# Patient Record
Sex: Male | Born: 1965 | Race: White | Hispanic: No | Marital: Married | Smoking: Never smoker
Health system: Southern US, Community
[De-identification: ages and names within clinical notes are randomized; demographics above are authoritative.]

## PROBLEM LIST (undated history)

## (undated) ENCOUNTER — Emergency Department (HOSPITAL_BASED_OUTPATIENT_CLINIC_OR_DEPARTMENT_OTHER): Admission: EM | Payer: Self-pay | Source: Home / Self Care

## (undated) ENCOUNTER — Emergency Department (HOSPITAL_COMMUNITY): Payer: 59 | Source: Home / Self Care

## (undated) DIAGNOSIS — R079 Chest pain, unspecified: Secondary | ICD-10-CM

## (undated) DIAGNOSIS — C6290 Malignant neoplasm of unspecified testis, unspecified whether descended or undescended: Secondary | ICD-10-CM

## (undated) DIAGNOSIS — G4700 Insomnia, unspecified: Secondary | ICD-10-CM

## (undated) DIAGNOSIS — C801 Malignant (primary) neoplasm, unspecified: Secondary | ICD-10-CM

## (undated) DIAGNOSIS — F410 Panic disorder [episodic paroxysmal anxiety] without agoraphobia: Secondary | ICD-10-CM

## (undated) DIAGNOSIS — I10 Essential (primary) hypertension: Secondary | ICD-10-CM

## (undated) HISTORY — DX: Essential (primary) hypertension: I10

## (undated) HISTORY — DX: Malignant neoplasm of unspecified testis, unspecified whether descended or undescended: C62.90

## (undated) HISTORY — DX: Panic disorder (episodic paroxysmal anxiety): F41.0

## (undated) HISTORY — PX: WISDOM TOOTH EXTRACTION: SHX21

## (undated) HISTORY — DX: Chest pain, unspecified: R07.9

## (undated) HISTORY — DX: Insomnia, unspecified: G47.00

---

## 2004-11-03 ENCOUNTER — Encounter: Admission: RE | Admit: 2004-11-03 | Discharge: 2004-11-30 | Payer: Self-pay | Admitting: *Deleted

## 2008-11-14 ENCOUNTER — Ambulatory Visit (HOSPITAL_BASED_OUTPATIENT_CLINIC_OR_DEPARTMENT_OTHER): Admission: RE | Admit: 2008-11-14 | Discharge: 2008-11-14 | Payer: Self-pay | Admitting: Urology

## 2008-11-14 ENCOUNTER — Encounter (INDEPENDENT_AMBULATORY_CARE_PROVIDER_SITE_OTHER): Payer: Self-pay | Admitting: Urology

## 2008-11-29 ENCOUNTER — Ambulatory Visit: Payer: Self-pay | Admitting: Oncology

## 2008-12-11 LAB — COMPREHENSIVE METABOLIC PANEL
ALT: 26 U/L (ref 0–53)
Alkaline Phosphatase: 80 U/L (ref 39–117)
CO2: 25 mEq/L (ref 19–32)
Potassium: 4.1 mEq/L (ref 3.5–5.3)
Sodium: 136 mEq/L (ref 135–145)
Total Bilirubin: 0.7 mg/dL (ref 0.3–1.2)
Total Protein: 7.6 g/dL (ref 6.0–8.3)

## 2008-12-11 LAB — CBC WITH DIFFERENTIAL/PLATELET
BASO%: 0.6 % (ref 0.0–2.0)
LYMPH%: 43.6 % (ref 14.0–49.0)
MCHC: 34.7 g/dL (ref 32.0–36.0)
MONO#: 0.5 10*3/uL (ref 0.1–0.9)
MONO%: 12.3 % (ref 0.0–14.0)
Platelets: 241 10*3/uL (ref 140–400)
RBC: 4.37 10*6/uL (ref 4.20–5.82)
RDW: 13.2 % (ref 11.0–14.6)
WBC: 3.8 10*3/uL — ABNORMAL LOW (ref 4.0–10.3)

## 2008-12-17 ENCOUNTER — Ambulatory Visit: Admission: RE | Admit: 2008-12-17 | Discharge: 2008-12-17 | Payer: Self-pay | Admitting: Oncology

## 2008-12-25 ENCOUNTER — Ambulatory Visit: Payer: Self-pay | Admitting: Oncology

## 2008-12-31 LAB — COMPREHENSIVE METABOLIC PANEL
ALT: 26 U/L (ref 0–53)
AST: 26 U/L (ref 0–37)
Alkaline Phosphatase: 67 U/L (ref 39–117)
Calcium: 9.5 mg/dL (ref 8.4–10.5)
Chloride: 102 mEq/L (ref 96–112)
Creatinine, Ser: 1.08 mg/dL (ref 0.40–1.50)

## 2008-12-31 LAB — CBC WITH DIFFERENTIAL/PLATELET
BASO%: 0.4 % (ref 0.0–2.0)
EOS%: 2.2 % (ref 0.0–7.0)
HCT: 39.9 % (ref 38.4–49.9)
MCH: 31.2 pg (ref 27.2–33.4)
MCHC: 35.8 g/dL (ref 32.0–36.0)
MCV: 86.9 fL (ref 79.3–98.0)
MONO%: 0.4 % (ref 0.0–14.0)
NEUT%: 62.2 % (ref 39.0–75.0)
RDW: 12.2 % (ref 11.0–14.6)
lymph#: 1 10*3/uL (ref 0.9–3.3)

## 2009-01-07 ENCOUNTER — Ambulatory Visit: Admission: RE | Admit: 2009-01-07 | Discharge: 2009-01-07 | Payer: Self-pay | Admitting: Oncology

## 2009-01-07 LAB — CBC WITH DIFFERENTIAL/PLATELET
BASO%: 2.7 % — ABNORMAL HIGH (ref 0.0–2.0)
Basophils Absolute: 0 10*3/uL (ref 0.0–0.1)
EOS%: 1.8 % (ref 0.0–7.0)
HCT: 34.9 % — ABNORMAL LOW (ref 38.4–49.9)
HGB: 12.1 g/dL — ABNORMAL LOW (ref 13.0–17.1)
LYMPH%: 84.5 % — ABNORMAL HIGH (ref 14.0–49.0)
MCH: 30.3 pg (ref 27.2–33.4)
MCHC: 34.7 g/dL (ref 32.0–36.0)
MCV: 87.3 fL (ref 79.3–98.0)
MONO%: 7.3 % (ref 0.0–14.0)
NEUT%: 3.7 % — ABNORMAL LOW (ref 39.0–75.0)
Platelets: 70 10*3/uL — ABNORMAL LOW (ref 140–400)
lymph#: 0.9 10*3/uL (ref 0.9–3.3)

## 2009-01-09 LAB — AFP TUMOR MARKER: AFP-Tumor Marker: 11.1 ng/mL — ABNORMAL HIGH (ref 0.0–8.0)

## 2009-01-09 LAB — COMPREHENSIVE METABOLIC PANEL
AST: 14 U/L (ref 0–37)
Albumin: 4.1 g/dL (ref 3.5–5.2)
Alkaline Phosphatase: 91 U/L (ref 39–117)
BUN: 16 mg/dL (ref 6–23)
Creatinine, Ser: 0.86 mg/dL (ref 0.40–1.50)
Glucose, Bld: 101 mg/dL — ABNORMAL HIGH (ref 70–99)
Total Bilirubin: 0.3 mg/dL (ref 0.3–1.2)

## 2009-01-13 LAB — COMPREHENSIVE METABOLIC PANEL
AST: 25 U/L (ref 0–37)
Albumin: 3.4 g/dL — ABNORMAL LOW (ref 3.5–5.2)
BUN: 19 mg/dL (ref 6–23)
Calcium: 8.7 mg/dL (ref 8.4–10.5)
Chloride: 106 mEq/L (ref 96–112)
Creatinine, Ser: 1.01 mg/dL (ref 0.40–1.50)
Glucose, Bld: 102 mg/dL — ABNORMAL HIGH (ref 70–99)
Potassium: 4 mEq/L (ref 3.5–5.3)

## 2009-01-13 LAB — CBC WITH DIFFERENTIAL/PLATELET
Basophils Absolute: 0 10*3/uL (ref 0.0–0.1)
EOS%: 0.5 % (ref 0.0–7.0)
Eosinophils Absolute: 0 10*3/uL (ref 0.0–0.5)
HCT: 33.5 % — ABNORMAL LOW (ref 38.4–49.9)
HGB: 11.6 g/dL — ABNORMAL LOW (ref 13.0–17.1)
MONO#: 0.5 10*3/uL (ref 0.1–0.9)
NEUT#: 1.1 10*3/uL — ABNORMAL LOW (ref 1.5–6.5)
NEUT%: 38.6 % — ABNORMAL LOW (ref 39.0–75.0)
RDW: 12.7 % (ref 11.0–14.6)
WBC: 3 10*3/uL — ABNORMAL LOW (ref 4.0–10.3)
lymph#: 1.3 10*3/uL (ref 0.9–3.3)

## 2009-01-21 LAB — CBC WITH DIFFERENTIAL/PLATELET
BASO%: 0.7 % (ref 0.0–2.0)
EOS%: 0 % (ref 0.0–7.0)
HCT: 37.5 % — ABNORMAL LOW (ref 38.4–49.9)
LYMPH%: 17.8 % (ref 14.0–49.0)
MCH: 31.5 pg (ref 27.2–33.4)
MCHC: 34.9 g/dL (ref 32.0–36.0)
NEUT%: 79.5 % — ABNORMAL HIGH (ref 39.0–75.0)
Platelets: 552 10*3/uL — ABNORMAL HIGH (ref 140–400)

## 2009-01-21 LAB — COMPREHENSIVE METABOLIC PANEL
ALT: 13 U/L (ref 0–53)
AST: 10 U/L (ref 0–37)
Creatinine, Ser: 0.91 mg/dL (ref 0.40–1.50)
Total Bilirubin: 0.9 mg/dL (ref 0.3–1.2)

## 2009-01-24 ENCOUNTER — Ambulatory Visit: Payer: Self-pay | Admitting: Oncology

## 2009-01-28 LAB — COMPREHENSIVE METABOLIC PANEL
ALT: 19 U/L (ref 0–53)
AST: 24 U/L (ref 0–37)
Albumin: 3.4 g/dL — ABNORMAL LOW (ref 3.5–5.2)
Calcium: 9.1 mg/dL (ref 8.4–10.5)
Chloride: 106 mEq/L (ref 96–112)
Potassium: 4.1 mEq/L (ref 3.5–5.3)
Sodium: 140 mEq/L (ref 135–145)

## 2009-01-28 LAB — CBC WITH DIFFERENTIAL/PLATELET
BASO%: 0 % (ref 0.0–2.0)
EOS%: 0.1 % (ref 0.0–7.0)
HCT: 29.1 % — ABNORMAL LOW (ref 38.4–49.9)
MCH: 32 pg (ref 27.2–33.4)
MCHC: 34.9 g/dL (ref 32.0–36.0)
MCV: 91.7 fL (ref 79.3–98.0)
NEUT#: 13.1 10*3/uL — ABNORMAL HIGH (ref 1.5–6.5)
Platelets: 100 10*3/uL — ABNORMAL LOW (ref 140–400)
RBC: 3.17 10*6/uL — ABNORMAL LOW (ref 4.20–5.82)
RDW: 12.5 % (ref 11.0–14.6)
WBC: 14.5 10*3/uL — ABNORMAL HIGH (ref 4.0–10.3)
lymph#: 1.3 10*3/uL (ref 0.9–3.3)

## 2009-01-30 ENCOUNTER — Emergency Department (HOSPITAL_COMMUNITY): Admission: EM | Admit: 2009-01-30 | Discharge: 2009-01-30 | Payer: Self-pay | Admitting: Emergency Medicine

## 2009-02-11 ENCOUNTER — Ambulatory Visit (HOSPITAL_COMMUNITY): Admission: RE | Admit: 2009-02-11 | Discharge: 2009-02-11 | Payer: Self-pay | Admitting: Oncology

## 2009-02-21 ENCOUNTER — Ambulatory Visit: Payer: Self-pay | Admitting: Oncology

## 2009-02-25 LAB — CBC WITH DIFFERENTIAL/PLATELET
Basophils Absolute: 0.1 10*3/uL (ref 0.0–0.1)
Eosinophils Absolute: 0.2 10*3/uL (ref 0.0–0.5)
HCT: 34.2 % — ABNORMAL LOW (ref 38.4–49.9)
HGB: 11.9 g/dL — ABNORMAL LOW (ref 13.0–17.1)
MCV: 93.8 fL (ref 79.3–98.0)
NEUT#: 1.4 10*3/uL — ABNORMAL LOW (ref 1.5–6.5)
NEUT%: 40 % (ref 39.0–75.0)
RDW: 17.2 % — ABNORMAL HIGH (ref 11.0–14.6)
lymph#: 1.2 10*3/uL (ref 0.9–3.3)

## 2009-02-27 LAB — COMPREHENSIVE METABOLIC PANEL
Albumin: 4.3 g/dL (ref 3.5–5.2)
BUN: 16 mg/dL (ref 6–23)
Calcium: 9.7 mg/dL (ref 8.4–10.5)
Chloride: 104 mEq/L (ref 96–112)
Creatinine, Ser: 0.85 mg/dL (ref 0.40–1.50)
Glucose, Bld: 101 mg/dL — ABNORMAL HIGH (ref 70–99)
Potassium: 4.6 mEq/L (ref 3.5–5.3)

## 2009-02-27 LAB — BETA HCG QUANT (REF LAB): Beta hCG, Tumor Marker: 0.6 m[IU]/mL (ref <5.0)

## 2009-04-28 ENCOUNTER — Ambulatory Visit: Payer: Self-pay | Admitting: Oncology

## 2009-04-30 LAB — CBC WITH DIFFERENTIAL/PLATELET
BASO%: 0.3 % (ref 0.0–2.0)
Basophils Absolute: 0 10*3/uL (ref 0.0–0.1)
EOS%: 0.3 % (ref 0.0–7.0)
HCT: 41.9 % (ref 38.4–49.9)
HGB: 14.4 g/dL (ref 13.0–17.1)
MCH: 33.3 pg (ref 27.2–33.4)
MCHC: 34.4 g/dL (ref 32.0–36.0)
MCV: 96.6 fL (ref 79.3–98.0)
MONO%: 8.5 % (ref 0.0–14.0)
NEUT%: 74.3 % (ref 39.0–75.0)
RDW: 12.9 % (ref 11.0–14.6)
lymph#: 1.7 10*3/uL (ref 0.9–3.3)

## 2009-05-03 LAB — COMPREHENSIVE METABOLIC PANEL
AST: 17 U/L (ref 0–37)
Albumin: 4.4 g/dL (ref 3.5–5.2)
Alkaline Phosphatase: 80 U/L (ref 39–117)
Glucose, Bld: 115 mg/dL — ABNORMAL HIGH (ref 70–99)
Potassium: 3.9 mEq/L (ref 3.5–5.3)
Sodium: 135 mEq/L (ref 135–145)
Total Bilirubin: 0.2 mg/dL — ABNORMAL LOW (ref 0.3–1.2)
Total Protein: 7.1 g/dL (ref 6.0–8.3)

## 2009-05-03 LAB — AFP TUMOR MARKER: AFP-Tumor Marker: 4.4 ng/mL (ref 0.0–8.0)

## 2009-05-17 HISTORY — PX: SURGERY SCROTAL / TESTICULAR: SUR1316

## 2009-07-18 ENCOUNTER — Ambulatory Visit: Payer: Self-pay | Admitting: Oncology

## 2009-07-25 ENCOUNTER — Ambulatory Visit (HOSPITAL_COMMUNITY): Admission: RE | Admit: 2009-07-25 | Discharge: 2009-07-25 | Payer: Self-pay | Admitting: Oncology

## 2009-07-25 LAB — COMPREHENSIVE METABOLIC PANEL
ALT: 25 U/L (ref 0–53)
AST: 26 U/L (ref 0–37)
Calcium: 10.1 mg/dL (ref 8.4–10.5)
Chloride: 100 mEq/L (ref 96–112)
Creatinine, Ser: 1.09 mg/dL (ref 0.40–1.50)
Potassium: 4.5 mEq/L (ref 3.5–5.3)
Sodium: 137 mEq/L (ref 135–145)

## 2009-07-25 LAB — CBC WITH DIFFERENTIAL/PLATELET
BASO%: 0.7 % (ref 0.0–2.0)
EOS%: 1.4 % (ref 0.0–7.0)
MCH: 32.8 pg (ref 27.2–33.4)
MCHC: 34.7 g/dL (ref 32.0–36.0)
NEUT%: 38.4 % — ABNORMAL LOW (ref 39.0–75.0)
RBC: 4.01 10*6/uL — ABNORMAL LOW (ref 4.20–5.82)
RDW: 15.2 % — ABNORMAL HIGH (ref 11.0–14.6)
lymph#: 1.5 10*3/uL (ref 0.9–3.3)

## 2009-07-29 LAB — BETA HCG QUANT (REF LAB): Beta hCG, Tumor Marker: 0.9 m[IU]/mL (ref <5.0)

## 2009-07-29 LAB — AFP TUMOR MARKER: AFP-Tumor Marker: 4.8 ng/mL (ref 0.0–8.0)

## 2009-10-23 ENCOUNTER — Ambulatory Visit: Payer: Self-pay | Admitting: Oncology

## 2009-10-27 ENCOUNTER — Ambulatory Visit (HOSPITAL_COMMUNITY): Admission: RE | Admit: 2009-10-27 | Discharge: 2009-10-27 | Payer: Self-pay | Admitting: Oncology

## 2009-10-27 LAB — COMPREHENSIVE METABOLIC PANEL
Alkaline Phosphatase: 69 U/L (ref 39–117)
CO2: 27 mEq/L (ref 19–32)
Creatinine, Ser: 1.02 mg/dL (ref 0.40–1.50)
Glucose, Bld: 110 mg/dL — ABNORMAL HIGH (ref 70–99)
Total Bilirubin: 0.8 mg/dL (ref 0.3–1.2)

## 2009-10-27 LAB — CBC WITH DIFFERENTIAL/PLATELET
BASO%: 0.7 % (ref 0.0–2.0)
Eosinophils Absolute: 0 10*3/uL (ref 0.0–0.5)
HCT: 40.5 % (ref 38.4–49.9)
LYMPH%: 39.2 % (ref 14.0–49.0)
MCHC: 34.2 g/dL (ref 32.0–36.0)
MCV: 94.8 fL (ref 79.3–98.0)
MONO#: 0.4 10*3/uL (ref 0.1–0.9)
MONO%: 12.3 % (ref 0.0–14.0)
NEUT%: 46.8 % (ref 39.0–75.0)
Platelets: 226 10*3/uL (ref 140–400)
WBC: 3.6 10*3/uL — ABNORMAL LOW (ref 4.0–10.3)

## 2009-10-27 LAB — LACTATE DEHYDROGENASE: LDH: 136 U/L (ref 94–250)

## 2009-10-29 LAB — BETA HCG QUANT (REF LAB): Beta hCG, Tumor Marker: 0.7 m[IU]/mL (ref <5.0)

## 2009-10-29 LAB — AFP TUMOR MARKER: AFP-Tumor Marker: 3.8 ng/mL (ref 0.0–8.0)

## 2010-01-08 IMAGING — CT CT ABDOMEN W/ CM
2 of 6 series · 16 of 46 positions shown, 18 images · IV contrast (omnipaque)
Comparison: None.

CT CHEST

CLINICAL DATA: History of recurrent testicular cancer.  Initial
diagnosis in the left testicle in 1113, for which the patient
underwent left orchiectomy.  Recurrence in the right testicle in
November 2008, for which the patient underwent right orchiectomy.  The
patient completed chemotherapy earlier this month.

CT CHEST, ABDOMEN AND PELVIS WITH CONTRAST 02/11/2009:
TECHNIQUE: Multidetector CT imaging of the chest, abdomen and
pelvis was performed following the standard protocol during bolus
administration of intravenous contrast.
Contrast: 1 ml [id number] IV.  Dilute Omnipaque as oral
contrast.

[Series 2: cap with st · axial · 0.78mm/px · z∈[-662,-72]mm · 13 of 136 slices shown, 15 images]
[im 9/136  soft-tissue]
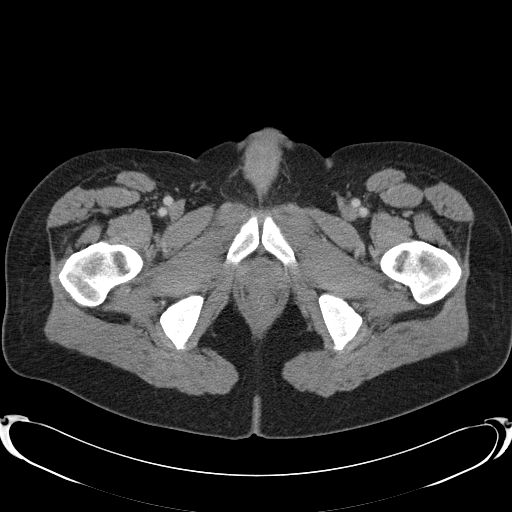
[im 9/136  bone]
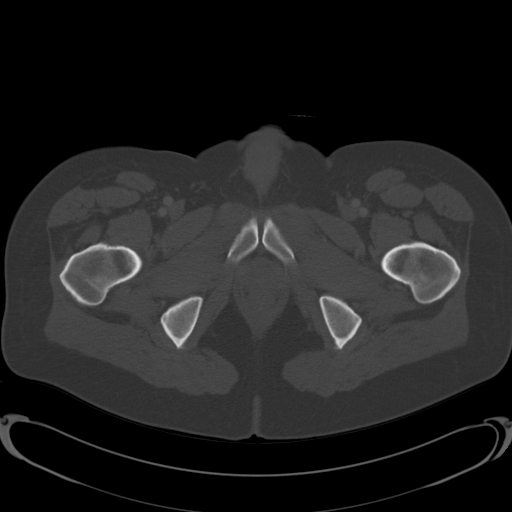
[im 17/136  soft-tissue]
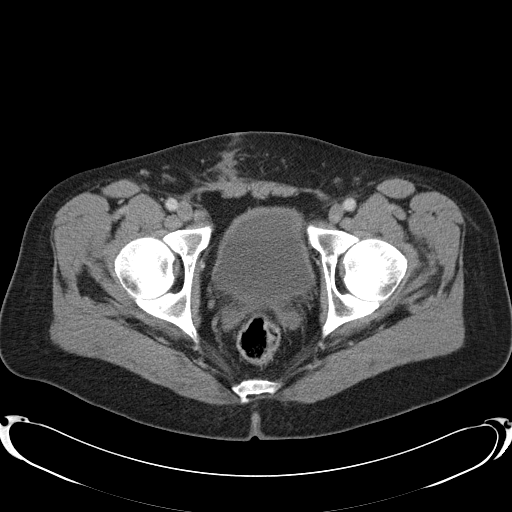
[im 26/136  soft-tissue]
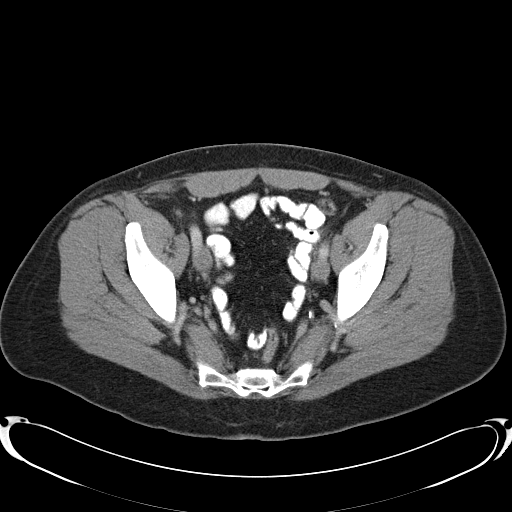
[im 43/136  soft-tissue]
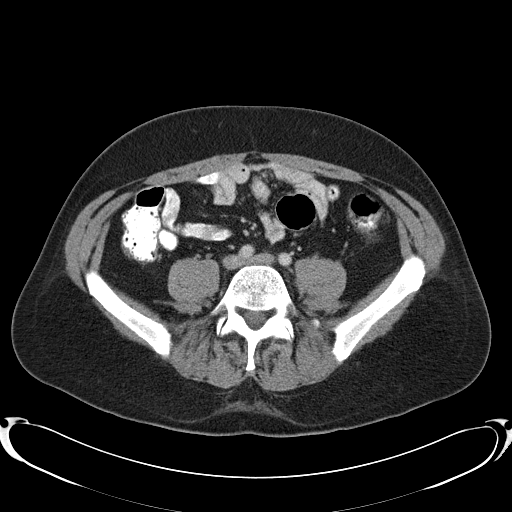
[im 51/136  soft-tissue]
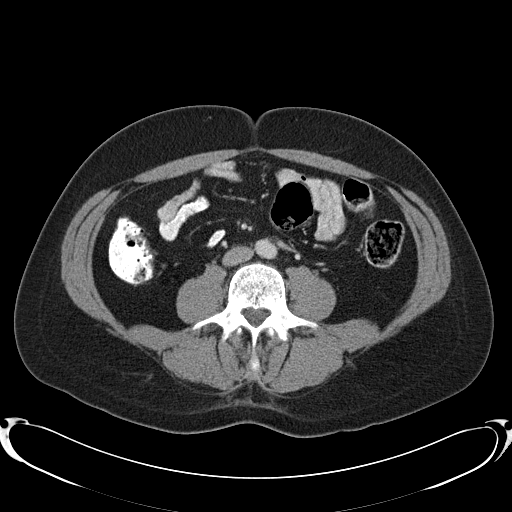
[im 60/136  soft-tissue]
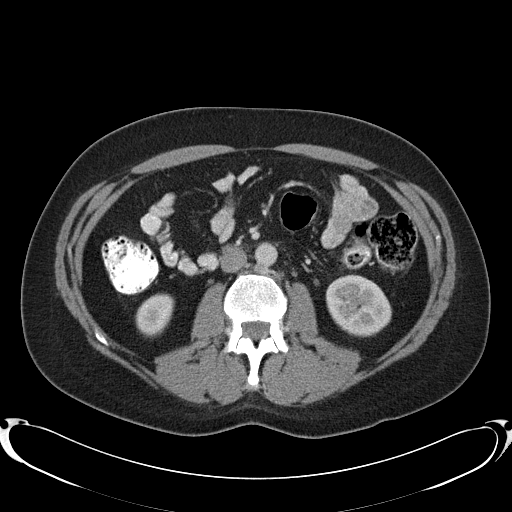
[im 68/136  soft-tissue]
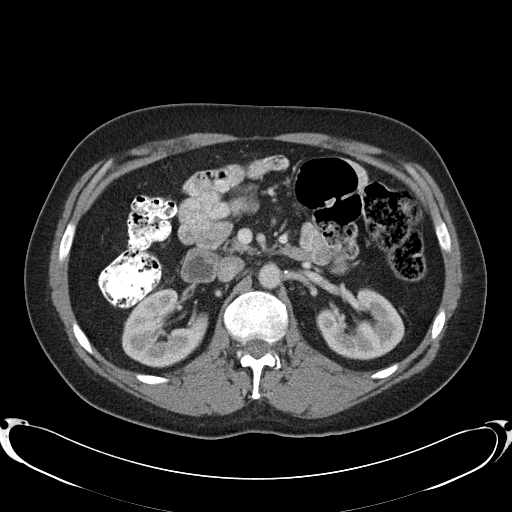
[im 76/136  soft-tissue]
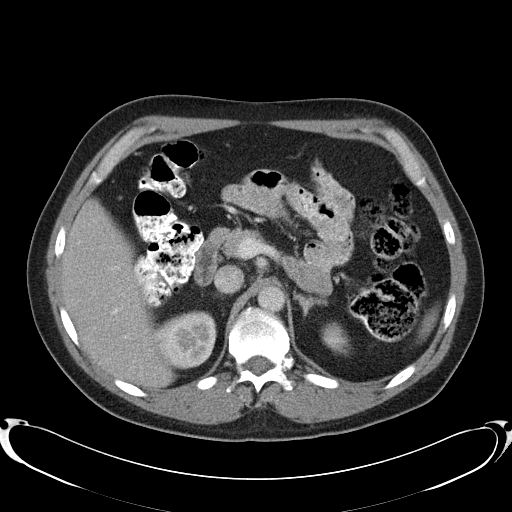
[im 85/136  soft-tissue]
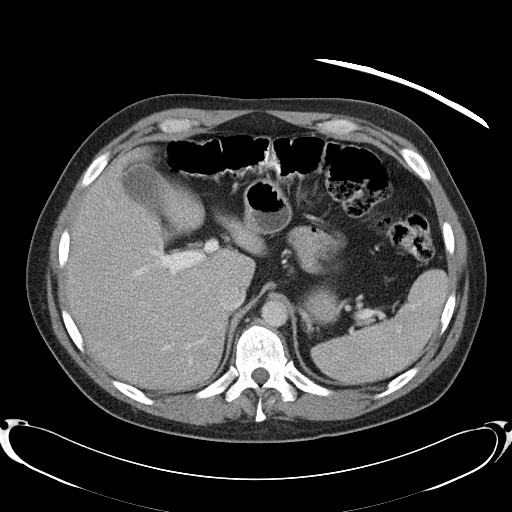
[im 85/136  bone]
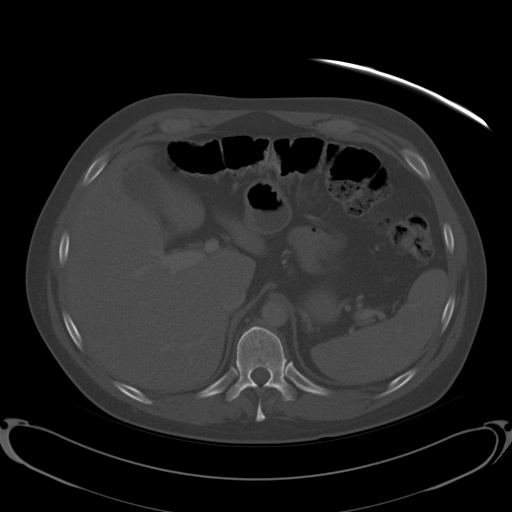
[im 93/136  soft-tissue]
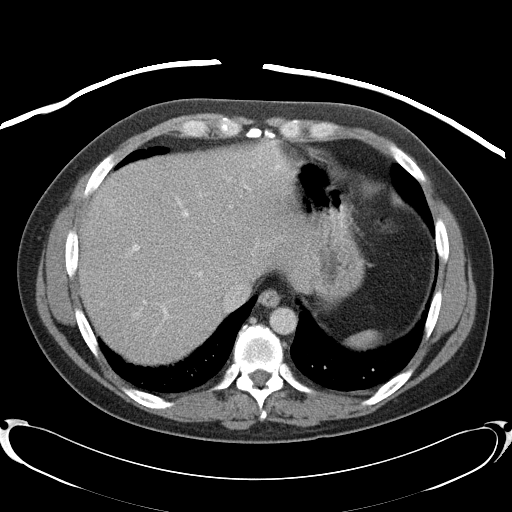
[im 110/136  soft-tissue]
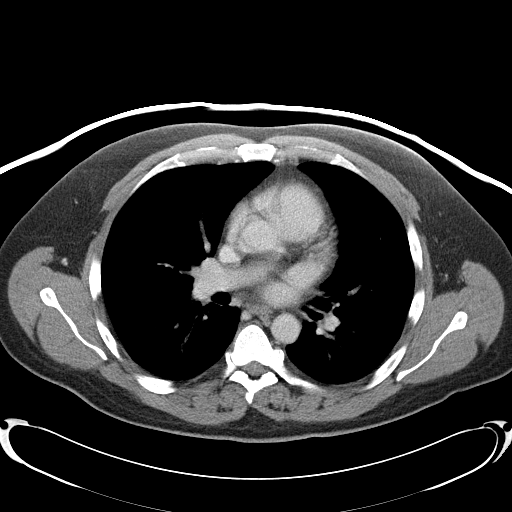
[im 119/136  soft-tissue]
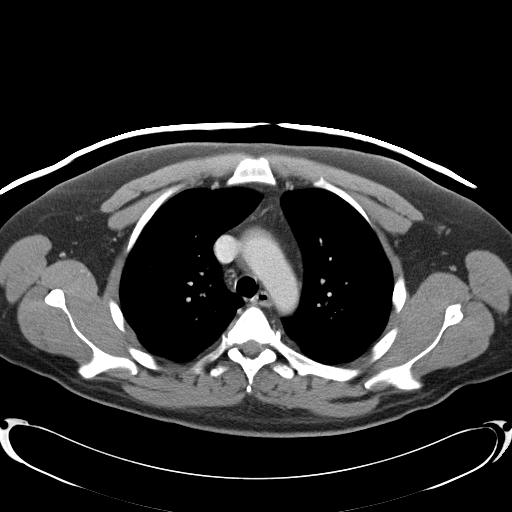
[im 127/136  soft-tissue]
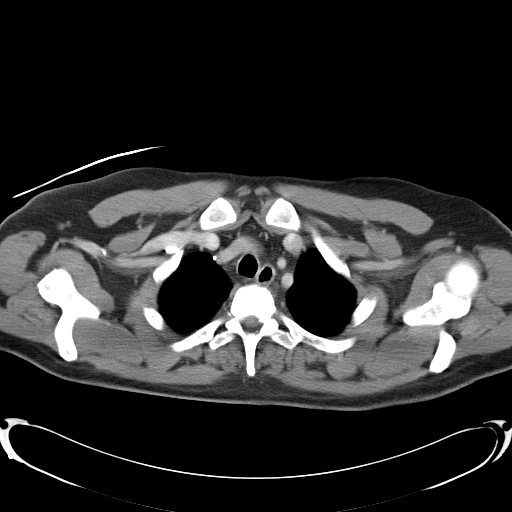

[Series 602: <mpr thick range> · coronal · 1.33mm/px · 3 of 79 slices shown]
[im 27/79  soft-tissue]
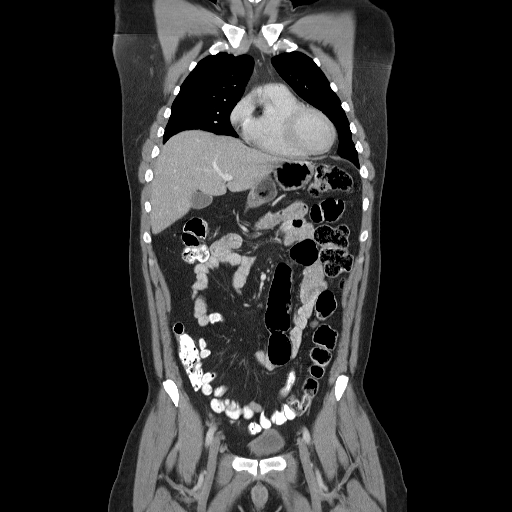
[im 35/79  soft-tissue]
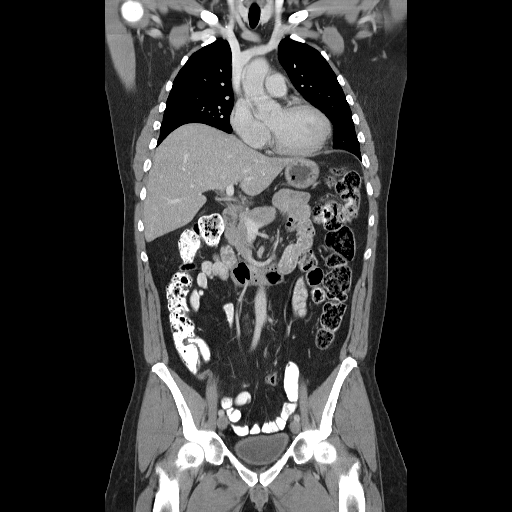
[im 44/79  soft-tissue]
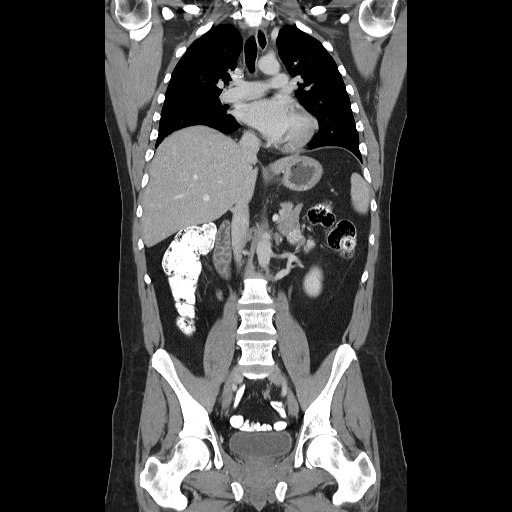

[16 of 46 positions shown; findings below may reference images not displayed]

FINDINGS: No significant mediastinal, hilar, or axillary
lymphadenopathy.  Heart size normal.  No visible coronary artery
calcification.  No pericardial effusion.  Bovine aortic arch
anatomy (left common carotid artery arising from the innominate
artery).  No significant thoracic aortic atherosclerosis.
Visualized thyroid gland unremarkable.

Subpleural node adjacent to the major fissure on the left.  No
suspicious pulmonary parenchymal nodules or masses.  Mild dependent
atelectasis posteriorly in the lower lobes. Lungs otherwise clear.
No evidence of interstitial lung disease.  No pleural effusions.
Bone window images demonstrate mid and lower thoracic spondylosis
but no evidence of osseous metastatic disease.
IMPRESSION: 1.  No evidence of metastatic disease in the chest.
2.  No acute cardiopulmonary disease.

CT ABDOMEN
FINDINGS: Scattered normal-sized retroperitoneal lymph nodes; no
significant lymphadenopathy in the abdomen.  Normal appearing
liver, spleen, pancreas, adrenal glands, and kidneys.  Gallbladder
unremarkable by CT.  No biliary ductal dilation.  Stomach and
visualized small bowel and colon unremarkable.  Minimal abdominal
aortic atherosclerosis.  Widely patent visceral arteries.
Circumaortic left renal vein.  No ascites.  Bone window images
demonstrate mild degenerative changes in the lumbar spine but no
evidence of osseous metastatic disease.
IMPRESSION: 1.  No evidence of metastatic disease in the abdomen.

2.  No acute abnormalities in the abdomen.

CT PELVIS
FINDINGS: Surgical scar in the right low pelvis.  Right testicular
prosthesis.  No significant pelvic lymphadenopathy.  Visualized
colon and small bowel unremarkable;  redundant sigmoid colon which
extends well into the upper abdomen.  No ascites.  Urinary bladder
unremarkable.  Prostate gland and seminal vesicles normal for age.
Bone window images unremarkable.
IMPRESSION: 1.  No evidence of metastatic disease in the pelvis.
2.  No acute abnormalities in the pelvis.

## 2010-04-28 ENCOUNTER — Ambulatory Visit (HOSPITAL_BASED_OUTPATIENT_CLINIC_OR_DEPARTMENT_OTHER): Payer: 59 | Admitting: Oncology

## 2010-04-30 LAB — CBC WITH DIFFERENTIAL/PLATELET
Basophils Absolute: 0 10*3/uL (ref 0.0–0.1)
EOS%: 1.4 % (ref 0.0–7.0)
HGB: 14.1 g/dL (ref 13.0–17.1)
MCH: 32.4 pg (ref 27.2–33.4)
MCV: 94.6 fL (ref 79.3–98.0)
MONO%: 16.2 % — ABNORMAL HIGH (ref 0.0–14.0)
NEUT%: 36.8 % — ABNORMAL LOW (ref 39.0–75.0)
RDW: 13.7 % (ref 11.0–14.6)

## 2010-05-05 LAB — COMPREHENSIVE METABOLIC PANEL
AST: 28 U/L (ref 0–37)
Alkaline Phosphatase: 82 U/L (ref 39–117)
BUN: 20 mg/dL (ref 6–23)
Creatinine, Ser: 1.05 mg/dL (ref 0.40–1.50)
Potassium: 4.1 mEq/L (ref 3.5–5.3)

## 2010-05-05 LAB — BETA HCG QUANT (REF LAB): Beta hCG, Tumor Marker: 0.7 m[IU]/mL (ref <5.0)

## 2010-06-05 ENCOUNTER — Other Ambulatory Visit: Payer: Self-pay | Admitting: Oncology

## 2010-06-05 DIAGNOSIS — C6290 Malignant neoplasm of unspecified testis, unspecified whether descended or undescended: Secondary | ICD-10-CM

## 2010-08-21 LAB — COMPREHENSIVE METABOLIC PANEL
Albumin: 3.5 g/dL (ref 3.5–5.2)
Alkaline Phosphatase: 108 U/L (ref 39–117)
BUN: 12 mg/dL (ref 6–23)
Chloride: 103 mEq/L (ref 96–112)
Glucose, Bld: 98 mg/dL (ref 70–99)
Potassium: 4 mEq/L (ref 3.5–5.1)
Total Bilirubin: 0.4 mg/dL (ref 0.3–1.2)

## 2010-08-21 LAB — URINALYSIS, ROUTINE W REFLEX MICROSCOPIC
Hgb urine dipstick: NEGATIVE
Ketones, ur: NEGATIVE mg/dL
Protein, ur: NEGATIVE mg/dL
Urobilinogen, UA: 0.2 mg/dL (ref 0.0–1.0)

## 2010-08-21 LAB — DIFFERENTIAL
Basophils Absolute: 0.1 10*3/uL (ref 0.0–0.1)
Basophils Relative: 0 % (ref 0–1)
Eosinophils Absolute: 0 10*3/uL (ref 0.0–0.7)
Monocytes Absolute: 0.7 10*3/uL (ref 0.1–1.0)
Neutro Abs: 15.8 10*3/uL — ABNORMAL HIGH (ref 1.7–7.7)
Neutrophils Relative %: 90 % — ABNORMAL HIGH (ref 43–77)

## 2010-08-21 LAB — CBC
HCT: 32.7 % — ABNORMAL LOW (ref 39.0–52.0)
Hemoglobin: 10.9 g/dL — ABNORMAL LOW (ref 13.0–17.0)
RBC: 3.5 MIL/uL — ABNORMAL LOW (ref 4.22–5.81)
WBC: 17.6 10*3/uL — ABNORMAL HIGH (ref 4.0–10.5)

## 2010-08-21 LAB — RAPID URINE DRUG SCREEN, HOSP PERFORMED
Amphetamines: NOT DETECTED
Barbiturates: NOT DETECTED
Tetrahydrocannabinol: NOT DETECTED

## 2010-08-23 LAB — POCT HEMOGLOBIN-HEMACUE: Hemoglobin: 15.3 g/dL (ref 13.0–17.0)

## 2010-09-29 NOTE — Op Note (Signed)
Jeff [name], Jeff [name]                 ACCOUNT NO.:  [id number]   MEDICAL RECORD NO.:  [id number]          PATIENT TYPE:  AMB   LOCATION:  [facility]                         FACILITY:  [facility]   PHYSICIAN:  [name], MD      DATE OF BIRTH:  1965-08-10   DATE OF PROCEDURE:  11/14/2008  DATE OF DISCHARGE:                               OPERATIVE REPORT   PREOPERATIVE DIAGNOSIS:  1. History of testicular cancer.  2. Right testicular mass.   POSTOPERATIVE DIAGNOSIS:  Testicular cancer.   PROCEDURES:  1. Right inguinal exploration.  2. Excisional biopsy of right testicular mass.  3. Intraoperative testicular ultrasound.  4. Right radical orchiectomy.  5. Placement of right testicular prosthesis.   SURGEON:  Dr. [name].   ASSISTANT:  Dr. Edward Qualia.   ANESTHESIA:  General.   COMPLICATIONS:  None.   ESTIMATED BLOOD LOSS:  Minimal.   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  He presented with a right testicular mass and right  testicular pain which he developed after a traumatic event.  He was  initially managed with close surveillance ultrasound which did not  demonstrate a mass with discrete borders, and his testicular tumor  markers were not initially elevated.  On repeat evaluation one week  later, his testicular tumor markers were noted to be rising, and his  mass was still present despite resolution of his pain.  We discussed  options and elected to proceed with the above procedures.  The potential  risks, complications, and alternative treatment options were discussed  in detail.  Specifically, we discussed the issue of infertility and  whether he would wish to bank any sperm preoperatively.  He declined  this option and elected to proceed as planned.   DESCRIPTION OF PROCEDURE:  The patient was taken to the operating room  and general anesthesia was  induced.  He was given preoperative  antibiotics, placed in the supine position, and his genitalia and right  inguinal region were prepped and draped in the usual sterile fashion.  The right testis was palpated and there was noted be a firm mass off the  upper pole of the right testis measuring approximately 1.5 cm.  An  inguinal incision was then made in the skin approximately 2 cm above the  inguinal ligament and carried down through subcutaneous tissues with  electrocautery.  Scarpa's fascia was identified and was divided and the  external oblique aponeurosis was identified and cleared off of the  overlying fat.  Using a scalpel, the external oblique was incised, and  this incision in the external oblique was then extended both cephalad  and inferior with Metzenbaum scissors.  The spermatic cord was  identified and was able to be encircled at the level of the pubic  tubercle.  A Penrose dressing was placed in a tourniquet fashion around  the spermatic cord in this location.  Care was taken to preserve the  ilioinguinal nerve.  The scrotum was then inverted allowing  the testicle  to be delivered into the operative field.  The gubernacular attachments  were carefully taken down with electrocautery and the testicle was  brought up into the operative field.  Sterile towels were then placed  around the testicle and spermatic cord, and the tourniquet was again  secured.  At this point the tunica vaginalis was incised and the  testicle was inspected.  There did appear to be a firm mass off the  anterior upper pole of the right testis. At this point intraoperative  ultrasound was used to visualize the testicular mass.  There was noted  to be a hypoechoic mass which was better defined on this examination  compared to his prior ultrasound evaluations.  This mass did appear to  extend toward the mid section of the testis and was very concerning for  a testicular malignancy.  An excisional biopsy  was then performed of the  aforementioned mass and sent to pathology for frozen section analysis.  This returned with features consistent with a testicular malignancy and  aspects of the specimen that were concerning for a high-grade embryonal  carcinoma.  At this point, sterile towels were used to isolate the  testis.  Sterile gloves were changed and the spermatic cord was isolated  up to the internal inguinal ring, where it was clamped with a Kelly  clamp.  The spermatic cord was then doubly ligated with a zero silk tie  as well as zero silk suture ligatures and divided, with the specimen  sent for permanent pathologic analysis.  The proximal silk tie was left  long and the spermatic cord stump was allowed to retract into the  retroperitoneum.  The inguinal canal and scrotum were then copiously  irrigated with antibiotic solution and preparations were made for  placement of the testicular prosthesis.  The scrotum was inverted with  the dependent portion of the scrotum identified.  A figure-of-eight 4-0  Prolene suture was then used to secure the medium sized Coloplast saline  testicular prosthesis.  It was filled with approximately 15 mL of saline  with all air attempted to be removed from the prosthesis.  This was then  brought into the dependent portion of the scrotum, and preparations were  made for closure.  The incision was then again irrigated copiously with  antibiotic solution and the external oblique aponeurosis was  reapproximated with a running zero silk suture.  Scarpa fascia was  reapproximated with a running 3-0 Vicryl suture and the skin was  reapproximated with a running 4-0 Monocryl subcuticular closure.  Dermabond was applied to the skin.  The patient appeared to tolerate the  procedure well without complications.  He was able to be awakened and  transferred to the recovery room in satisfactory condition.      [name], MD  Electronically Signed      LB/MEDQ  D:  11/14/2008  T:  11/14/2008  Job:  [id number]

## 2010-10-14 ENCOUNTER — Other Ambulatory Visit: Payer: Self-pay | Admitting: Oncology

## 2010-10-14 DIAGNOSIS — C6290 Malignant neoplasm of unspecified testis, unspecified whether descended or undescended: Secondary | ICD-10-CM

## 2010-10-22 ENCOUNTER — Ambulatory Visit (HOSPITAL_COMMUNITY)
Admission: RE | Admit: 2010-10-22 | Discharge: 2010-10-22 | Disposition: A | Discharge status: Home / Self Care | Payer: 59 | Source: Ambulatory Visit | Attending: Oncology | Admitting: Oncology

## 2010-10-22 ENCOUNTER — Encounter (HOSPITAL_COMMUNITY): Payer: Self-pay

## 2010-10-22 ENCOUNTER — Other Ambulatory Visit (HOSPITAL_COMMUNITY): Payer: Self-pay

## 2010-10-22 DIAGNOSIS — I700 Atherosclerosis of aorta: Secondary | ICD-10-CM | POA: Insufficient documentation

## 2010-10-22 DIAGNOSIS — C6290 Malignant neoplasm of unspecified testis, unspecified whether descended or undescended: Secondary | ICD-10-CM

## 2010-10-22 DIAGNOSIS — Z9689 Presence of other specified functional implants: Secondary | ICD-10-CM | POA: Insufficient documentation

## 2010-10-22 DIAGNOSIS — Z8547 Personal history of malignant neoplasm of testis: Secondary | ICD-10-CM | POA: Insufficient documentation

## 2010-10-22 DIAGNOSIS — J984 Other disorders of lung: Secondary | ICD-10-CM | POA: Insufficient documentation

## 2010-10-22 DIAGNOSIS — I998 Other disorder of circulatory system: Secondary | ICD-10-CM | POA: Insufficient documentation

## 2010-10-22 DIAGNOSIS — I701 Atherosclerosis of renal artery: Secondary | ICD-10-CM | POA: Insufficient documentation

## 2010-10-22 HISTORY — DX: Malignant (primary) neoplasm, unspecified: C80.1

## 2010-10-22 LAB — CBC WITH DIFFERENTIAL/PLATELET
BASO%: 0.7 % (ref 0.0–2.0)
Eosinophils Absolute: 0.1 10*3/uL (ref 0.0–0.5)
MCHC: 34.6 g/dL (ref 32.0–36.0)
MONO#: 0.7 10*3/uL (ref 0.1–0.9)
NEUT#: 1.7 10*3/uL (ref 1.5–6.5)
Platelets: 206 10*3/uL (ref 140–400)
RBC: 4.93 10*6/uL (ref 4.20–5.82)
RDW: 13.9 % (ref 11.0–14.6)
WBC: 4.2 10*3/uL (ref 4.0–10.3)
lymph#: 1.8 10*3/uL (ref 0.9–3.3)

## 2010-10-22 MED ORDER — IOHEXOL 300 MG/ML  SOLN
125.0000 mL | Freq: Once | INTRAMUSCULAR | Status: AC | PRN
  Administered 2010-10-22: 125 mL via INTRAVENOUS

## 2010-10-24 LAB — COMPREHENSIVE METABOLIC PANEL
ALT: 22 U/L (ref 0–53)
AST: 22 U/L (ref 0–37)
CO2: 23 mEq/L (ref 19–32)
Calcium: 8.9 mg/dL (ref 8.4–10.5)
Chloride: 105 mEq/L (ref 96–112)
Potassium: 4.5 mEq/L (ref 3.5–5.3)
Sodium: 135 mEq/L (ref 135–145)
Total Protein: 6.8 g/dL (ref 6.0–8.3)

## 2010-10-24 LAB — LACTATE DEHYDROGENASE: LDH: 146 U/L (ref 94–250)

## 2010-10-24 LAB — BETA HCG QUANT (REF LAB): Beta hCG, Tumor Marker: 0.8 m[IU]/mL (ref <5.0)

## 2010-10-29 ENCOUNTER — Encounter (HOSPITAL_BASED_OUTPATIENT_CLINIC_OR_DEPARTMENT_OTHER): Payer: 59 | Admitting: Oncology

## 2010-10-29 DIAGNOSIS — C6290 Malignant neoplasm of unspecified testis, unspecified whether descended or undescended: Secondary | ICD-10-CM

## 2010-10-29 DIAGNOSIS — D709 Neutropenia, unspecified: Secondary | ICD-10-CM

## 2011-04-21 ENCOUNTER — Encounter: Payer: Self-pay | Admitting: *Deleted

## 2011-04-28 ENCOUNTER — Encounter: Payer: Self-pay | Admitting: *Deleted

## 2011-04-30 ENCOUNTER — Ambulatory Visit: Payer: 59 | Admitting: Oncology

## 2011-04-30 ENCOUNTER — Other Ambulatory Visit: Payer: 59

## 2011-06-04 ENCOUNTER — Encounter: Payer: Self-pay | Admitting: *Deleted

## 2011-06-08 ENCOUNTER — Other Ambulatory Visit: Payer: 59 | Admitting: Lab

## 2011-06-08 ENCOUNTER — Ambulatory Visit (HOSPITAL_BASED_OUTPATIENT_CLINIC_OR_DEPARTMENT_OTHER): Payer: 59 | Admitting: Oncology

## 2011-06-08 ENCOUNTER — Telehealth: Payer: Self-pay | Admitting: Oncology

## 2011-06-08 VITALS — BP 160/99 | HR 103 | Temp 97.4°F | Ht 71.0 in | Wt 234.5 lb

## 2011-06-08 DIAGNOSIS — I10 Essential (primary) hypertension: Secondary | ICD-10-CM

## 2011-06-08 DIAGNOSIS — D709 Neutropenia, unspecified: Secondary | ICD-10-CM

## 2011-06-08 DIAGNOSIS — C6290 Malignant neoplasm of unspecified testis, unspecified whether descended or undescended: Secondary | ICD-10-CM

## 2011-06-08 LAB — LACTATE DEHYDROGENASE: LDH: 203 U/L (ref 94–250)

## 2011-06-08 LAB — CBC WITH DIFFERENTIAL/PLATELET
BASO%: 0.5 % (ref 0.0–2.0)
EOS%: 1.1 % (ref 0.0–7.0)
MCH: 32.3 pg (ref 27.2–33.4)
MCHC: 34.6 g/dL (ref 32.0–36.0)
MONO#: 0.7 10*3/uL (ref 0.1–0.9)
RBC: 4.36 10*6/uL (ref 4.20–5.82)
RDW: 13.9 % (ref 11.0–14.6)
WBC: 5.4 10*3/uL (ref 4.0–10.3)
lymph#: 1.8 10*3/uL (ref 0.9–3.3)

## 2011-06-08 LAB — COMPREHENSIVE METABOLIC PANEL
ALT: 44 U/L (ref 0–53)
AST: 40 U/L — ABNORMAL HIGH (ref 0–37)
CO2: 22 mEq/L (ref 19–32)
Calcium: 9.1 mg/dL (ref 8.4–10.5)
Chloride: 104 mEq/L (ref 96–112)
Sodium: 137 mEq/L (ref 135–145)
Total Protein: 8 g/dL (ref 6.0–8.3)

## 2011-06-08 NOTE — Progress Notes (Signed)
Hematology and Oncology Follow Up Visit  JHAMIR PICKUP [id number] Dec 10, 1965 46 y.o. 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  He had an embryonal component for his cancer diagnosed in July 2007.    Prior Therapy:  1. Status post orchiectomy done in July 2010. 2. The patient received 2 cycles adjuvant BEP for stage IB disease.  Therapy concluded in September 2010.  He had no evidence of any residual disease and continues to be in remission at this time. 3. 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  Interim History:  Mr. Winegarden presents today for a followup visit.  He is a very healthy 46 year old gentleman with the above history.  He has continued to do very well without any evidence to suggest recurrent disease.  He had not reported any abdominal pain, had not reported any major appetite changes; as a matter of fact, he had weight gain.  He has continued on testosterone supplements and had not really reported any major changes in his activity level or performance status.  He had not reported any weight loss.  Did not report any back pain.  Did not report any early satiety or lymphadenopathy.  Medications: I have reviewed the patient's current medications. Current outpatient prescriptions:buPROPion (WELLBUTRIN XL) 300 MG 24 hr tablet, Take 300 mg by mouth daily.  , Disp: , Rfl: ;  zolpidem (AMBIEN) 10 MG tablet, Take 10 mg by mouth at bedtime as needed.  , Disp: , Rfl:   Allergies:  Allergies  Allergen Reactions  . Penicillins     Past Medical History, Surgical history, Social history, and Family History were reviewed and updated.  Review of Systems: Constitutional:  Negative for fever, chills, night sweats, anorexia, weight loss, pain. Cardiovascular: no chest pain or dyspnea on exertion Respiratory: no cough, shortness of  breath, or wheezing Neurological: no TIA or stroke symptoms Dermatological: negative ENT: negative Skin: Negative. Gastrointestinal: no abdominal pain, change in bowel habits, or black or bloody stools Genito-Urinary: no dysuria, trouble voiding, or hematuria Hematological and Lymphatic: negative Breast: negative Musculoskeletal: negative Remaining ROS negative. Physical Exam: Blood pressure 160/99, pulse 103, temperature 97.4 F (36.3 C), temperature source Oral, height 5\' 11"  (1.803 m), weight 234 lb 8 oz (106.369 kg). ECOG: 0 General appearance: alert Head: Normocephalic, without obvious abnormality, atraumatic Neck: no adenopathy, no carotid bruit, no JVD, supple, symmetrical, trachea midline and thyroid not enlarged, symmetric, no tenderness/mass/nodules Lymph nodes: Cervical, supraclavicular, and axillary nodes normal. Heart:regular rate and rhythm, S1, S2 normal, no murmur, click, rub or gallop Lung:chest clear, no wheezing, rales, normal symmetric air entry Abdomin: soft, non-tender, without masses or organomegaly EXT:no erythema, induration, or nodules   Lab Results: Lab Results  Component Value Date   WBC 5.4 06/08/2011   HGB 14.1 06/08/2011   HCT 40.8 06/08/2011   MCV 93.4 06/08/2011   PLT 217 06/08/2011     Chemistry      Component Value Date/Time   NA 135 10/22/2010 0957   K 4.5 10/22/2010 0957   CL 105 10/22/2010 0957   CO2 23 10/22/2010 0957   BUN 20 10/22/2010 0957   CREATININE 1.06 10/22/2010 0957      Component Value Date/Time   CALCIUM 8.9 10/22/2010 0957   ALKPHOS 86 10/22/2010 0957   AST 22 10/22/2010 0957   ALT 22 10/22/2010 0957  BILITOT 0.4 10/22/2010 0957          Impression and Plan:  46 year old gentleman with the following issues: 1. Stage IB nonseminomatous testes cancer status post orchiectomy followed by 2 cycles of BEP.  No evidence of any recurrent disease at the time being.  CT scan is negative as of June 2012.  We will continue on active  surveillance at the time being, have him come back in 6 months' time and repeat physical examination as well as laboratory check and repeat imaging studies on an annual basis. I will obtain a CXR on 10/2011 2. Neutropenia.  White cell count has recovered completely normal at this point. 3. Testosterone deficiency currently on testosterone injections. 4. Hypertension.  I have asked him to do some lifestyle modification and have an ambulatory monitoring of his blood pressure. I also encouraged him to follow up with his PCP as well.    Eli Hose, MD 1/22/20134:29 PM

## 2011-06-08 NOTE — Telephone Encounter (Signed)
appt made and printed for 6/26,aom

## 2011-10-06 ENCOUNTER — Other Ambulatory Visit: Payer: Self-pay | Admitting: *Deleted

## 2011-10-06 DIAGNOSIS — C6290 Malignant neoplasm of unspecified testis, unspecified whether descended or undescended: Secondary | ICD-10-CM

## 2011-10-06 MED ORDER — ZOLPIDEM TARTRATE 10 MG PO TABS: 10.0000 mg | ORAL_TABLET | Freq: Every evening | ORAL | Status: DC | PRN

## 2011-11-09 ENCOUNTER — Telehealth: Payer: Self-pay | Admitting: Oncology

## 2011-11-09 NOTE — Telephone Encounter (Signed)
Pt called today and r/s'd 6/26 appt to 7/18.

## 2011-11-10 ENCOUNTER — Other Ambulatory Visit: Payer: 59 | Admitting: Lab

## 2011-11-10 ENCOUNTER — Ambulatory Visit: Payer: 59 | Admitting: Oncology

## 2011-12-02 ENCOUNTER — Other Ambulatory Visit (HOSPITAL_BASED_OUTPATIENT_CLINIC_OR_DEPARTMENT_OTHER): Payer: 59 | Admitting: Lab

## 2011-12-02 ENCOUNTER — Telehealth: Payer: Self-pay | Admitting: Oncology

## 2011-12-02 ENCOUNTER — Ambulatory Visit (HOSPITAL_BASED_OUTPATIENT_CLINIC_OR_DEPARTMENT_OTHER): Payer: 59 | Admitting: Oncology

## 2011-12-02 VITALS — BP 136/92 | HR 67 | Temp 98.6°F | Ht 71.0 in | Wt 235.8 lb

## 2011-12-02 DIAGNOSIS — C6290 Malignant neoplasm of unspecified testis, unspecified whether descended or undescended: Secondary | ICD-10-CM

## 2011-12-02 DIAGNOSIS — I10 Essential (primary) hypertension: Secondary | ICD-10-CM

## 2011-12-02 DIAGNOSIS — D709 Neutropenia, unspecified: Secondary | ICD-10-CM

## 2011-12-02 LAB — CBC WITH DIFFERENTIAL/PLATELET
Basophils Absolute: 0 10*3/uL (ref 0.0–0.1)
EOS%: 0.7 % (ref 0.0–7.0)
HCT: 39.2 % (ref 38.4–49.9)
HGB: 13.7 g/dL (ref 13.0–17.1)
MCH: 31.8 pg (ref 27.2–33.4)
MCV: 91 fL (ref 79.3–98.0)
MONO%: 11.2 % (ref 0.0–14.0)
NEUT%: 54.8 % (ref 39.0–75.0)
Platelets: 207 10*3/uL (ref 140–400)
lymph#: 1.4 10*3/uL (ref 0.9–3.3)

## 2011-12-02 NOTE — Progress Notes (Signed)
Hematology and Oncology Follow Up Visit  Jeff [name] [id number] 07-09-1965 46 y.o. 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    Prior Therapy:  1. Status post orchiectomy done in July 2010. 2. The patient received 2 cycles adjuvant BEP for stage IB disease.  Therapy concluded in September 2010.  He had no evidence of any residual disease and continues to be in remission at this time. 3. 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  Interim History:  Jeff [name] presents today for a followup visit.  He is a very healthy 46 year old gentleman with the above history.  He has continued to do very well without any evidence to suggest recurrent disease.  He had not reported any abdominal pain, had not reported any major appetite changes; as a matter of fact, he had weight gain.  He has continued on testosterone supplements and had not really reported any major changes in his activity level or performance status.  He had not reported any weight loss.  Did not report any back pain.  Did not report any early satiety or lymphadenopathy.  Medications: I have reviewed the patient's current medications. Current outpatient prescriptions:testosterone cypionate (DEPOTESTOTERONE CYPIONATE) 200 MG/ML injection, Inject into the muscle every 14 (fourteen) days., Disp: , Rfl: ;  zolpidem (AMBIEN) 10 MG tablet, Take 1 tablet (10 mg total) by mouth at bedtime as needed., Disp: 30 tablet, Rfl: 1;  buPROPion (WELLBUTRIN XL) 300 MG 24 hr tablet, Take 300 mg by mouth daily.  , Disp: , Rfl:   Allergies:  Allergies  Allergen Reactions  . Penicillins     Past Medical History, Surgical history, Social history, and Family History were reviewed and updated.  Review of Systems: Constitutional:   Negative for fever, chills, night sweats, anorexia, weight loss, pain. Cardiovascular: no chest pain or dyspnea on exertion Respiratory: no cough, shortness of breath, or wheezing Neurological: no TIA or stroke symptoms Dermatological: negative ENT: negative Skin: Negative. Gastrointestinal: no abdominal pain, change in bowel habits, or black or bloody stools Genito-Urinary: no dysuria, trouble voiding, or hematuria Hematological and Lymphatic: negative Breast: negative Musculoskeletal: negative Remaining ROS negative. Physical Exam: Blood pressure 136/92, pulse 67, temperature 98.6 F (37 C), temperature source Oral, height 5\' 11"  (1.803 m), weight 235 lb 12.8 oz (106.958 kg). ECOG: 0 General appearance: alert Head: Normocephalic, without obvious abnormality, atraumatic Neck: no adenopathy, no carotid bruit, no JVD, supple, symmetrical, trachea midline and thyroid not enlarged, symmetric, no tenderness/mass/nodules Lymph nodes: Cervical, supraclavicular, and axillary nodes normal. Heart:regular rate and rhythm, S1, S2 normal, no murmur, click, rub or gallop Lung:chest clear, no wheezing, rales, normal symmetric air entry Abdomin: soft, non-tender, without masses or organomegaly EXT:no erythema, induration, or nodules   Lab Results: Lab Results  Component Value Date   WBC 4.4 12/02/2011   HGB 13.7 12/02/2011   HCT 39.2 12/02/2011   MCV 91.0 12/02/2011   PLT 207 12/02/2011     Chemistry      Component Value Date/Time   NA 137 06/08/2011 1545   K 4.0 06/08/2011 1545   CL 104 06/08/2011 1545   CO2 22 06/08/2011 1545   BUN 21 06/08/2011 1545   CREATININE 1.02 06/08/2011 1545      Component Value Date/Time  CALCIUM 9.1 06/08/2011 1545   ALKPHOS 96 06/08/2011 1545   AST 40* 06/08/2011 1545   ALT 44 06/08/2011 1545   BILITOT 0.3 06/08/2011 1545     Results for [name], KOENIGS (MRN [id number]) as of 12/02/2011 13:43  Ref. Range 06/08/2011 15:45  AFP-Tumor Marker Latest Range: 0.0-8.0  ng/mL 4.2  Beta hCG, Tumor Marker Latest Range: <5.0 mIU/mL 0.5       Impression and Plan:  46 year old gentleman with the following issues: 1. Stage IB nonseminomatous testes cancer status post orchiectomy followed by 2 cycles of BEP.  No evidence of any recurrent disease at the time being.  CT scan is negative as of June 2012.  We will continue on active surveillance at the time being, have him come back in 6 months' time and repeat physical examination as well as laboratory check and repeat imaging studies as needed. 2. Neutropenia.  White cell count has recovered completely normal at this point. 3. Testosterone deficiency currently on testosterone injections. 4. Hypertension.  Better at today's visit.     [facility], MD 7/18/20131:42 PM

## 2011-12-02 NOTE — Telephone Encounter (Signed)
Gave pt appt for January 2014 lab and MD °

## 2011-12-08 LAB — AFP TUMOR MARKER: AFP-Tumor Marker: 4.9 ng/mL (ref 0.0–8.0)

## 2011-12-08 LAB — COMPREHENSIVE METABOLIC PANEL
AST: 27 U/L (ref 0–37)
BUN: 22 mg/dL (ref 6–23)
Calcium: 9.6 mg/dL (ref 8.4–10.5)
Chloride: 103 mEq/L (ref 96–112)
Creatinine, Ser: 1.03 mg/dL (ref 0.50–1.35)

## 2011-12-31 ENCOUNTER — Other Ambulatory Visit: Payer: Self-pay | Admitting: *Deleted

## 2011-12-31 DIAGNOSIS — C6290 Malignant neoplasm of unspecified testis, unspecified whether descended or undescended: Secondary | ICD-10-CM

## 2011-12-31 MED ORDER — ZOLPIDEM TARTRATE 10 MG PO TABS: 10.0000 mg | ORAL_TABLET | Freq: Every evening | ORAL | Status: DC | PRN

## 2012-05-24 ENCOUNTER — Telehealth: Payer: Self-pay | Admitting: *Deleted

## 2012-05-24 ENCOUNTER — Other Ambulatory Visit: Payer: Self-pay | Admitting: *Deleted

## 2012-05-24 DIAGNOSIS — C6290 Malignant neoplasm of unspecified testis, unspecified whether descended or undescended: Secondary | ICD-10-CM

## 2012-05-24 MED ORDER — ZOLPIDEM TARTRATE 10 MG PO TABS: 10.0000 mg | ORAL_TABLET | Freq: Every evening | ORAL | Status: DC | PRN

## 2012-05-24 NOTE — Telephone Encounter (Signed)
Refill request to MD desk for review. 

## 2012-05-25 NOTE — Telephone Encounter (Signed)
Called patient to let him know a script has been called in to [facility].

## 2012-06-01 ENCOUNTER — Telehealth: Payer: Self-pay | Admitting: Oncology

## 2012-06-01 NOTE — Telephone Encounter (Signed)
pt needed to r/s appt to late feb.Marland KitchenMarland KitchenDone

## 2012-06-02 ENCOUNTER — Other Ambulatory Visit: Payer: 59

## 2012-06-02 ENCOUNTER — Ambulatory Visit: Payer: 59 | Admitting: Oncology

## 2012-07-11 ENCOUNTER — Telehealth: Payer: Self-pay | Admitting: Oncology

## 2012-07-11 ENCOUNTER — Other Ambulatory Visit (HOSPITAL_BASED_OUTPATIENT_CLINIC_OR_DEPARTMENT_OTHER): Payer: 59 | Admitting: Lab

## 2012-07-11 ENCOUNTER — Ambulatory Visit (HOSPITAL_BASED_OUTPATIENT_CLINIC_OR_DEPARTMENT_OTHER): Payer: 59 | Admitting: Oncology

## 2012-07-11 VITALS — BP 167/98 | HR 85 | Temp 98.4°F | Resp 20 | Wt 238.5 lb

## 2012-07-11 DIAGNOSIS — C6290 Malignant neoplasm of unspecified testis, unspecified whether descended or undescended: Secondary | ICD-10-CM

## 2012-07-11 LAB — CBC WITH DIFFERENTIAL/PLATELET
Basophils Absolute: 0 10*3/uL (ref 0.0–0.1)
EOS%: 1.5 % (ref 0.0–7.0)
HCT: 42.4 % (ref 38.4–49.9)
HGB: 14.2 g/dL (ref 13.0–17.1)
MCH: 31.7 pg (ref 27.2–33.4)
MCHC: 33.6 g/dL (ref 32.0–36.0)
MCV: 94.5 fL (ref 79.3–98.0)
MONO%: 10.9 % (ref 0.0–14.0)
NEUT%: 50.3 % (ref 39.0–75.0)

## 2012-07-11 LAB — COMPREHENSIVE METABOLIC PANEL (CC13)
BUN: 19 mg/dL (ref 7.0–26.0)
CO2: 25 mEq/L (ref 22–29)
Creatinine: 1.1 mg/dL (ref 0.7–1.3)
Glucose: 112 mg/dl — ABNORMAL HIGH (ref 70–99)
Total Bilirubin: 0.68 mg/dL (ref 0.20–1.20)

## 2012-07-11 NOTE — Progress Notes (Signed)
Hematology and Oncology Follow Up Visit  Jeff [name] [id number] 03/15/1966 47 y.o. 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   Prior Therapy:  1. Status post orchiectomy done in July 2010. 2. The patient received 2 cycles adjuvant BEP for stage IB disease.  Therapy concluded in September 2010.  He had no evidence of any residual disease and continues to be in remission at this time. 3. 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  Interim History:  Jeff [name] presents today for a followup visit.  He is a very healthy 47 year old gentleman with the above history.  He has continued to do very well without any evidence to suggest recurrent disease.  He had not reported any abdominal pain, had not reported any major appetite changes. He has continued on testosterone supplements and had not really reported any major changes in his activity level or performance status. Did not report any back pain.  Did not report any early satiety or lymphadenopathy.  Medications: I have reviewed the patient's current medications. Current outpatient prescriptions:buPROPion (WELLBUTRIN XL) 300 MG 24 hr tablet, Take 300 mg by mouth daily.  , Disp: , Rfl: ;  testosterone cypionate (DEPOTESTOTERONE CYPIONATE) 200 MG/ML injection, Inject into the muscle every 14 (fourteen) days., Disp: , Rfl: ;  zolpidem (AMBIEN) 10 MG tablet, Take 1 tablet (10 mg total) by mouth at bedtime as needed. Script was faxed to [facility] 05/24/12, Disp: 30 tablet, Rfl: 0  Allergies:  Allergies  Allergen Reactions  . Penicillins     Past Medical History, Surgical history, Social history, and Family History were reviewed and updated.  Review of Systems: Constitutional:  Negative for fever, chills, night sweats,  anorexia, weight loss, pain. Cardiovascular: no chest pain or dyspnea on exertion Respiratory: no cough, shortness of breath, or wheezing Neurological: no TIA or stroke symptoms Dermatological: negative ENT: negative Skin: Negative. Gastrointestinal: no abdominal pain, change in bowel habits, or black or bloody stools Genito-Urinary: no dysuria, trouble voiding, or hematuria Hematological and Lymphatic: negative Breast: negative Musculoskeletal: negative Remaining ROS negative. Physical Exam: Blood pressure 167/98, pulse 85, temperature 98.4 F (36.9 C), temperature source Oral, resp. rate 20, weight 238 lb 8 oz (108.183 kg). ECOG: 0 General appearance: alert Head: Normocephalic, without obvious abnormality, atraumatic Neck: no adenopathy, no carotid bruit, no JVD, supple, symmetrical, trachea midline and thyroid not enlarged, symmetric, no tenderness/mass/nodules Lymph nodes: Cervical, supraclavicular, and axillary nodes normal. Heart:regular rate and rhythm, S1, S2 normal, no murmur, click, rub or gallop Lung:chest clear, no wheezing, rales, normal symmetric air entry Abdomin: soft, non-tender, without masses or organomegaly EXT:no erythema, induration, or nodules   Lab Results: Lab Results  Component Value Date   WBC 4.9 07/11/2012   HGB 14.2 07/11/2012   HCT 42.4 07/11/2012   MCV 94.5 07/11/2012   PLT 227 07/11/2012     Chemistry      Component Value Date/Time   NA 137 12/02/2011 1311   K 4.6 12/02/2011 1311   CL 103 12/02/2011 1311   CO2 27 12/02/2011 1311   BUN 22 12/02/2011 1311   CREATININE 1.03 12/02/2011 1311      Component Value Date/Time   CALCIUM 9.6 12/02/2011 1311   ALKPHOS 69 12/02/2011 1311   AST 27  12/02/2011 1311   ALT 27 12/02/2011 1311   BILITOT 0.5 12/02/2011 1311     Results for OTHEL, HOOGENDOORN (MRN [id number]) as of 12/02/2011 13:43  Ref. Range 06/08/2011 15:45  AFP-Tumor Marker Latest Range: 0.0-8.0 ng/mL 4.2  Beta hCG, Tumor Marker Latest Range: <5.0  mIU/mL 0.5       Impression and Plan:  47 year old gentleman with the following issues: 1. Stage IB nonseminomatous testes cancer status post orchiectomy followed by 2 cycles of BEP.  No evidence of any recurrent disease at the time being.  CT scan is negative as of June 2012.  We will continue on active surveillance at the time being, have him come back in 6 months' time and repeat physical examination as well as laboratory check and repeat imaging studies as needed. 2. Testosterone deficiency currently on testosterone injections. 3. Hypertension.  Follow up with his PCP for that.     [facility], MD 2/25/20144:17 PM

## 2012-07-11 NOTE — Telephone Encounter (Signed)
Gave pt appt for lab and MD for August 2014 °

## 2012-12-22 ENCOUNTER — Telehealth: Payer: Self-pay | Admitting: *Deleted

## 2012-12-22 NOTE — Telephone Encounter (Signed)
Lm informing the pt that [facility] will be out of the office on 01/09/13. gv appt d/t for 01/16/13 labs@3 :30pm and ov @4pm .i made the pt aware that i would mail a letter/avs as well....td

## 2013-01-09 ENCOUNTER — Ambulatory Visit: Payer: 59 | Admitting: Oncology

## 2013-01-09 ENCOUNTER — Other Ambulatory Visit: Payer: 59 | Admitting: Lab

## 2013-01-16 ENCOUNTER — Ambulatory Visit (HOSPITAL_BASED_OUTPATIENT_CLINIC_OR_DEPARTMENT_OTHER): Payer: 59 | Admitting: Oncology

## 2013-01-16 ENCOUNTER — Other Ambulatory Visit (HOSPITAL_BASED_OUTPATIENT_CLINIC_OR_DEPARTMENT_OTHER): Payer: 59 | Admitting: Lab

## 2013-01-16 ENCOUNTER — Telehealth: Payer: Self-pay | Admitting: Oncology

## 2013-01-16 VITALS — BP 150/91 | HR 80 | Temp 97.9°F | Resp 18 | Ht 71.0 in | Wt 234.1 lb

## 2013-01-16 DIAGNOSIS — C6290 Malignant neoplasm of unspecified testis, unspecified whether descended or undescended: Secondary | ICD-10-CM

## 2013-01-16 DIAGNOSIS — E291 Testicular hypofunction: Secondary | ICD-10-CM

## 2013-01-16 DIAGNOSIS — I10 Essential (primary) hypertension: Secondary | ICD-10-CM

## 2013-01-16 LAB — CBC WITH DIFFERENTIAL/PLATELET
Basophils Absolute: 0 10*3/uL (ref 0.0–0.1)
EOS%: 1.2 % (ref 0.0–7.0)
HGB: 14.6 g/dL (ref 13.0–17.1)
LYMPH%: 42 % (ref 14.0–49.0)
MCH: 31.5 pg (ref 27.2–33.4)
MCV: 92.2 fL (ref 79.3–98.0)
MONO%: 12.2 % (ref 0.0–14.0)
NEUT%: 44.2 % (ref 39.0–75.0)
RDW: 13.3 % (ref 11.0–14.6)

## 2013-01-16 LAB — COMPREHENSIVE METABOLIC PANEL (CC13)
AST: 26 U/L (ref 5–34)
Alkaline Phosphatase: 71 U/L (ref 40–150)
BUN: 18.8 mg/dL (ref 7.0–26.0)
Creatinine: 1.1 mg/dL (ref 0.7–1.3)
Potassium: 4.2 mEq/L (ref 3.5–5.1)
Total Bilirubin: 0.78 mg/dL (ref 0.20–1.20)

## 2013-01-16 NOTE — Progress Notes (Signed)
Hematology and Oncology Follow Up [name]  Jeff [name] [id number] [birth year] 47 y.o. 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   Prior Therapy:  1. Status post orchiectomy done in July 2010. 2. The patient received 2 cycles adjuvant BEP for stage IB disease.  Therapy concluded in September 2010.  He had no evidence of any residual disease and continues to be in remission at this time. 3. 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  Interim History:  Jeff [name]. He is a nice gentleman with the above history.  He has continued to do very well without any evidence to suggest recurrent disease.  He had not reported any abdominal pain, had not reported any major appetite changes. He has continued on testosterone supplements and had not really reported any major changes in his activity level or performance status. Did not report any back pain.  Did not report any early satiety or lymphadenopathy.  Medications: I have reviewed the patient's current medications.  Current Outpatient Prescriptions  Medication Sig Dispense Refill  . buPROPion (WELLBUTRIN XL) 300 MG 24 hr tablet Take 300 mg by mouth daily.        Marland Kitchen testosterone cypionate (DEPOTESTOTERONE CYPIONATE) 200 MG/ML injection Inject into the muscle every 14 (fourteen) days.      Marland Kitchen zolpidem (AMBIEN) 10 MG tablet Take 1 tablet (10 mg total) by mouth at bedtime as needed. Script was faxed [facility] 05/24/12  30 tablet  0   No current facility-administered medications for this [name].    Allergies:  Allergies  Allergen Reactions  . Penicillins     Past Medical History, Surgical history, Social history, and Family History were reviewed and updated.  Review of  Systems:  Remaining ROS negative. Physical Exam: Blood pressure 150/91, pulse 80, temperature 97.9 F (36.6 C), temperature source Oral, resp. rate 18, height 5\' 11"  (1.803 m), weight 234 lb 1.6 oz (106.187 kg), SpO2 100.00%. ECOG: 0 General appearance: alert Head: Normocephalic, without obvious abnormality, atraumatic Neck: no adenopathy, no carotid bruit, no JVD, supple, symmetrical, trachea midline and thyroid not enlarged, symmetric, no tenderness/mass/nodules Lymph nodes: Cervical, supraclavicular, and axillary nodes normal. Heart:regular rate and rhythm, S1, S2 normal, no murmur, click, rub or gallop Lung:chest clear, no wheezing, rales, normal symmetric air entry Abdomin: soft, non-tender, without masses or organomegaly EXT:no erythema, induration, or nodules   Lab Results: Lab Results  Component Value Date   WBC 5.1 01/16/2013   HGB 14.6 01/16/2013   HCT 42.8 01/16/2013   MCV 92.2 01/16/2013   PLT 225 01/16/2013     Chemistry      Component Value Date/Time   NA 137 01/16/2013 1543   NA 137 12/02/2011 1311   K 4.2 01/16/2013 1543   K 4.6 12/02/2011 1311   CL 102 07/11/2012 1523   CL 103 12/02/2011 1311   CO2 20* 01/16/2013 1543   CO2 27 12/02/2011 1311   BUN 18.8 01/16/2013 1543   BUN 22 12/02/2011 1311   CREATININE 1.1 01/16/2013 1543   CREATININE 1.03 12/02/2011 1311      Component Value Date/Time   CALCIUM 9.3 01/16/2013 1543   CALCIUM 9.6 12/02/2011 1311   ALKPHOS 71 01/16/2013 1543   ALKPHOS  69 12/02/2011 1311   AST 26 01/16/2013 1543   AST 27 12/02/2011 1311   ALT 30 01/16/2013 1543   ALT 27 12/02/2011 1311   BILITOT 0.78 01/16/2013 1543   BILITOT 0.5 12/02/2011 1311       Results for Jeff [name], Jeff [name] (MRN [id number]) as of 01/16/2013 16:05  Ref. Range 07/11/2012 15:23  AFP-Tumor Marker Latest Range: 0.0-8.0 ng/mL 5.3  Beta hCG, Tumor Marker Latest Range: <5.0 mIU/mL 0.7     Impression and Plan:  47 year old gentleman with the following issues: 1. Stage IB nonseminomatous testes  cancer status post orchiectomy followed by 2 cycles of BEP.  No evidence of any recurrent disease at the time being. CT scan on june 2012 did not show any disease.  We will continue on active surveillance at the time being, have him come back in 6 months' time and repeat physical examination as well as laboratory check and repeat imaging studies as needed. 2. Testosterone deficiency currently on testosterone injections. 3. Hypertension.  Follow up with his PCP for that.      [facility], MD 9/2/20144:29 PM

## 2013-01-16 NOTE — Telephone Encounter (Signed)
gv and printed appt sched and avs forpt for march 2015 °

## 2013-01-19 LAB — BETA HCG QUANT (REF LAB): Beta hCG, Tumor Marker: <0.5 m[IU]/mL (ref <5.0)

## 2013-05-19 ENCOUNTER — Encounter (HOSPITAL_COMMUNITY): Payer: Self-pay | Admitting: Emergency Medicine

## 2013-05-19 ENCOUNTER — Emergency Department (HOSPITAL_COMMUNITY): Payer: 59

## 2013-05-19 ENCOUNTER — Emergency Department (HOSPITAL_COMMUNITY)
Admission: EM | Admit: 2013-05-19 | Discharge: 2013-05-19 | Disposition: A | Discharge status: Home / Self Care | Payer: 59 | Attending: Emergency Medicine | Admitting: Emergency Medicine

## 2013-05-19 DIAGNOSIS — F410 Panic disorder [episodic paroxysmal anxiety] without agoraphobia: Secondary | ICD-10-CM | POA: Insufficient documentation

## 2013-05-19 DIAGNOSIS — Z8547 Personal history of malignant neoplasm of testis: Secondary | ICD-10-CM | POA: Insufficient documentation

## 2013-05-19 DIAGNOSIS — Z79899 Other long term (current) drug therapy: Secondary | ICD-10-CM | POA: Insufficient documentation

## 2013-05-19 DIAGNOSIS — Z880 Allergy status to penicillin: Secondary | ICD-10-CM | POA: Insufficient documentation

## 2013-05-19 DIAGNOSIS — R42 Dizziness and giddiness: Secondary | ICD-10-CM | POA: Insufficient documentation

## 2013-05-19 DIAGNOSIS — R209 Unspecified disturbances of skin sensation: Secondary | ICD-10-CM | POA: Insufficient documentation

## 2013-05-19 DIAGNOSIS — F17200 Nicotine dependence, unspecified, uncomplicated: Secondary | ICD-10-CM | POA: Insufficient documentation

## 2013-05-19 DIAGNOSIS — F419 Anxiety disorder, unspecified: Secondary | ICD-10-CM

## 2013-05-19 LAB — BASIC METABOLIC PANEL
BUN: 23 mg/dL (ref 6–23)
CO2: 24 mEq/L (ref 19–32)
CREATININE: 1.06 mg/dL (ref 0.50–1.35)
Calcium: 9.1 mg/dL (ref 8.4–10.5)
Chloride: 100 mEq/L (ref 96–112)
GFR, EST NON AFRICAN AMERICAN: 81 mL/min — AB (ref >90)
Glucose, Bld: 110 mg/dL — ABNORMAL HIGH (ref 70–99)
Potassium: 4.3 mEq/L (ref 3.7–5.3)
Sodium: 136 mEq/L — ABNORMAL LOW (ref 137–147)

## 2013-05-19 LAB — CBC
HCT: 39.3 % (ref 39.0–52.0)
Hemoglobin: 13.5 g/dL (ref 13.0–17.0)
MCH: 32.1 pg (ref 26.0–34.0)
MCHC: 34.4 g/dL (ref 30.0–36.0)
MCV: 93.6 fL (ref 78.0–100.0)
Platelets: 209 10*3/uL (ref 150–400)
RBC: 4.2 MIL/uL — ABNORMAL LOW (ref 4.22–5.81)
RDW: 13.1 % (ref 11.5–15.5)
WBC: 3.9 10*3/uL — AB (ref 4.0–10.5)

## 2013-05-19 LAB — POCT I-STAT TROPONIN I: Troponin i, poc: 0 ng/mL (ref 0.00–0.08)

## 2013-05-19 MED ORDER — LORAZEPAM 1 MG PO TABS: 1.0000 mg | ORAL_TABLET | Freq: Three times a day (TID) | ORAL | Status: DC | PRN

## 2013-05-19 NOTE — Discharge Instructions (Signed)
Anxiety and Panic Attacks Anxiety is your body's way of reacting to real danger or something you think is a danger. It may be fear or worry over a situation like losing your job. Sometimes the cause is not known. A panic attack is made up of physical signs like sweating, shaking, or chest pain. Anxiety and panic attacks may start suddenly. They may be strong. They may come at any time of day, even while sleeping. They may come at any time of life. Panic attacks are scary, but they do not harm you physically.  HOME CARE  Avoid any known causes of your anxiety.  Try to relax. Yoga may help. Tell yourself everything will be okay.  Exercise often.  Get expert advice and help (therapy) to stop anxiety or attacks from happening.  Avoid caffeine, alcohol, and drugs.  Only take medicine as told by your doctor. GET HELP RIGHT AWAY IF:  Your attacks seem different than normal attacks.  Your problems are getting worse or concern you. MAKE SURE YOU:  Understand these instructions.  Will watch your condition.  Will get help right away if you are not doing well or get worse. Document Released: 06/05/2010 Document Revised: 08/28/2012 Document Reviewed: 12/15/2012 [facility] Patient Information 2014 [place], [place].

## 2013-05-19 NOTE — ED Notes (Signed)
MD at bedside. 

## 2013-05-19 NOTE — ED Notes (Signed)
Pt reports weakness/dizziness and tingling in extremities and R chest that started an hour ago. Pt reports similar thing happened on 1/1, was seen in ED in [place], was dx with panic attack. Pt alert and oriented, denies pain

## 2013-05-19 NOTE — ED Provider Notes (Signed)
CSN: [id number]     Arrival date & time 05/19/13  1327 History   First MD Initiated Contact with Patient 05/19/13 1453     Chief Complaint  Patient presents with  . Fatigue    HPI  Patient presents with a second episode will last 3 days.  On Monday he was in [place] and had an episode when he was trying on some clothes.  He started feeling lightheaded tingling in his fingers.  Numbness around his mouth but a sense of doom.  The weekend had in his legs and had to sit down.  His friend helped him next arrest.  The sac.  He drinks water.  He did feel better.  He went back outside still felt lightheaded and tingling and anxious.  Demetra called.  He was hypertensive but not tachycardic hypoxemic.  Symptoms resolved.  Yesterday he had the symptoms..  Today, he had recurrence of similar episodes at work.  No syncope.  No chest pain.  No palpitations.  History of depression.  No history of anxiety or panic attacks.  History of 2 prior episodes of testicular cancer status post bilateral orchiectomy.  He said pelvic radiotherapy and adjuvant chemotherapy.  Remission from what appears with normal biochemical markers in November.  Past Medical History  Diagnosis Date  . recurrent testicullar ca dx'd lt 1990; rt 2010  . Testicular cancer    History reviewed. No pertinent past surgical history. History reviewed. No pertinent family history. History  Substance Use Topics  . Smoking status: Current Some Day Smoker  . Smokeless tobacco: Not on file  . Alcohol Use: Yes    Review of Systems  Constitutional: Negative for fever, chills, diaphoresis, appetite change and fatigue.  HENT: Negative for mouth sores, sore throat and trouble swallowing.   Eyes: Negative for visual disturbance.  Respiratory: Negative for cough, chest tightness, shortness of breath and wheezing.   Cardiovascular: Negative for chest pain and palpitations.  Gastrointestinal: Negative for nausea, vomiting, abdominal pain, diarrhea  and abdominal distention.  Endocrine: Negative for polydipsia, polyphagia and polyuria.  Genitourinary: Negative for dysuria, frequency and hematuria.  Musculoskeletal: Negative for gait problem.  Skin: Negative for color change, pallor and rash.  Neurological: Positive for dizziness, weakness, light-headedness and numbness. Negative for syncope and headaches.  Hematological: Does not bruise/bleed easily.  Psychiatric/Behavioral: Negative for behavioral problems and confusion.    Allergies  Penicillins  Home Medications   Current Outpatient Rx  Name  Route  Sig  Dispense  Refill  . buPROPion (WELLBUTRIN XL) 300 MG 24 hr tablet   Oral   Take 300 mg by mouth daily.           Marland Kitchen testosterone cypionate (DEPOTESTOTERONE CYPIONATE) 200 MG/ML injection   Intramuscular   Inject 200 mg into the muscle every 14 (fourteen) days.          Marland Kitchen zolpidem (AMBIEN) 10 MG tablet   Oral   Take 1 tablet (10 mg total) by mouth at bedtime as needed. Script was faxed [facility] 05/24/12   30 tablet   0     Called in to [facility] [phone]   . LORazepam (ATIVAN) 1 MG tablet   Oral   Take 1 tablet (1 mg total) by mouth 3 (three) times daily as needed for anxiety.   15 tablet   0    BP 145/80  Pulse 65  Temp(Src) 97.4 F (36.3 C) (Oral)  Resp 16  SpO2 96% Physical  Exam  Constitutional: He is oriented to person, place, and time. He appears well-developed and well-nourished. No distress.  Seems anxious or exam.  He is otherwise awake alert oriented lucid  HENT:  Head: Normocephalic.  Eyes: Conjunctivae are normal. Pupils are equal, round, and reactive to light. No scleral icterus.  Neck: Normal range of motion. Neck supple. No thyromegaly present.  Cardiovascular: Normal rate and regular rhythm.  Exam reveals no gallop and no friction rub.   No murmur heard. Pulmonary/Chest: Effort normal and breath sounds normal. No respiratory distress. He has no wheezes. He has no  rales.  Abdominal: Soft. Bowel sounds are normal. He exhibits no distension. There is no tenderness. There is no rebound.  Musculoskeletal: Normal range of motion.  Neurological: He is alert and oriented to person, place, and time.  Skin: Skin is warm and dry. No rash noted.  Psychiatric: He has a normal mood and affect. His behavior is normal.    ED Course  Procedures (including critical care time) Labs Review Labs Reviewed  BASIC METABOLIC PANEL - Abnormal; Notable for the following:    Sodium 136 (*)    Glucose, Bld 110 (*)    GFR calc non Af Amer 81 (*)    All other components within normal limits  CBC - Abnormal; Notable for the following:    WBC 3.9 (*)    RBC 4.20 (*)    All other components within normal limits  TSH  POCT I-STAT TROPONIN I   Imaging Review Dg Chest 2 View  05/19/2013   CLINICAL DATA:  Shortness of breath. Congestion. Fatigue. History of testicular cancer.  EXAM: CHEST  2 VIEW  COMPARISON:  Chest x-ray 07/25/2009.  Chest CT C7 2012.  FINDINGS: Lung volumes are low. No consolidative airspace disease. No pleural effusions. No pneumothorax. No pulmonary nodule or mass noted. Pulmonary vasculature and the cardiomediastinal silhouette are within normal limits.  IMPRESSION: 1.  No radiographic evidence of acute cardiopulmonary disease. 2. No definite findings to suggest metastatic disease to the lungs at this time.   Electronically Signed   By: Vinnie Langton M.D.   On: 05/19/2013 16:04   Ct Head Wo Contrast  05/19/2013   CLINICAL DATA:  Weakness and dizziness, with tingling in extremities for 1 hr, similar symptoms 2 days ago diagnosed with panic attack  EXAM: CT HEAD WITHOUT CONTRAST  TECHNIQUE: Contiguous axial images were obtained from the base of the skull through the vertex without intravenous contrast.  COMPARISON:  None.  FINDINGS: No mass lesion. No midline shift. No acute hemorrhage or hematoma. No extra-axial fluid collections. No evidence of acute  infarction. Calvarium is intact.  IMPRESSION: Negative   Electronically Signed   By: Skipper Cliche M.D.   On: 05/19/2013 16:30    EKG Interpretation    Date/Time:  Saturday May 19 2013 13:42:10 EST Ventricular Rate:  73 PR Interval:  175 QRS Duration: 92 QT Interval:  391 QTC Calculation: 431 R Axis:   37 Text Interpretation:  Sinus rhythm Baseline wander in lead(s) V1 V4 V5 Confirmed by Jeneen Rinks  MD, [place] ([id number]) on 05/19/2013 3:08:58 PM            MDM   1. Anxiety   2. Panic attacks     His evaluation there is normal.  EKG shows no center presentation or ectopy.  Thyroid function is pending.  He has good primary care followup at [facility] prevascular, and recheck his thyroid function.  Thank you for breakthrough  episodes of anxiety and panic attack.  Stretch given for when necessary Ativan.  Percussion of onset infection driving or working.  Reticulocyte recurrence or worsening symptoms.    Tanna Furry, MD 05/19/13 [phone]

## 2013-05-20 LAB — TSH: TSH: 1.397 u[IU]/mL (ref 0.350–4.500)

## 2013-07-16 ENCOUNTER — Telehealth: Payer: Self-pay | Admitting: Hematology and Oncology

## 2013-07-16 NOTE — Telephone Encounter (Signed)
pt called to r/s 3/3 lb/fu to 4/2. pt has new d/t.

## 2013-07-17 ENCOUNTER — Ambulatory Visit: Payer: 59 | Admitting: Oncology

## 2013-07-17 ENCOUNTER — Other Ambulatory Visit: Payer: 59

## 2013-08-14 ENCOUNTER — Telehealth: Payer: Self-pay | Admitting: Oncology

## 2013-08-14 NOTE — Telephone Encounter (Signed)
pt called to r/s appt don pt aware of new d.t

## 2013-08-16 ENCOUNTER — Other Ambulatory Visit: Payer: 59

## 2013-08-16 ENCOUNTER — Ambulatory Visit: Payer: 59 | Admitting: Oncology

## 2013-09-19 ENCOUNTER — Ambulatory Visit (HOSPITAL_BASED_OUTPATIENT_CLINIC_OR_DEPARTMENT_OTHER): Payer: 59 | Admitting: Oncology

## 2013-09-19 ENCOUNTER — Telehealth: Payer: Self-pay | Admitting: Oncology

## 2013-09-19 ENCOUNTER — Other Ambulatory Visit: Payer: Self-pay | Admitting: Medical Oncology

## 2013-09-19 ENCOUNTER — Other Ambulatory Visit (HOSPITAL_BASED_OUTPATIENT_CLINIC_OR_DEPARTMENT_OTHER): Payer: 59

## 2013-09-19 ENCOUNTER — Encounter: Payer: Self-pay | Admitting: Oncology

## 2013-09-19 VITALS — BP 160/97 | HR 112 | Temp 97.0°F | Resp 18 | Wt 207.4 lb

## 2013-09-19 DIAGNOSIS — I10 Essential (primary) hypertension: Secondary | ICD-10-CM

## 2013-09-19 DIAGNOSIS — C6290 Malignant neoplasm of unspecified testis, unspecified whether descended or undescended: Secondary | ICD-10-CM | POA: Insufficient documentation

## 2013-09-19 DIAGNOSIS — E291 Testicular hypofunction: Secondary | ICD-10-CM

## 2013-09-19 LAB — CBC WITH DIFFERENTIAL/PLATELET
BASO%: 0.5 % (ref 0.0–2.0)
Basophils Absolute: 0 10*3/uL (ref 0.0–0.1)
EOS%: 1.8 % (ref 0.0–7.0)
Eosinophils Absolute: 0.1 10*3/uL (ref 0.0–0.5)
HCT: 39.1 % (ref 38.4–49.9)
HGB: 13.2 g/dL (ref 13.0–17.1)
LYMPH#: 1.8 10*3/uL (ref 0.9–3.3)
LYMPH%: 46.8 % (ref 14.0–49.0)
MCH: 31.3 pg (ref 27.2–33.4)
MCHC: 33.8 g/dL (ref 32.0–36.0)
MCV: 92.7 fL (ref 79.3–98.0)
MONO#: 0.4 10*3/uL (ref 0.1–0.9)
MONO%: 11.3 % (ref 0.0–14.0)
NEUT#: 1.5 10*3/uL (ref 1.5–6.5)
NEUT%: 39.6 % (ref 39.0–75.0)
Platelets: 204 10*3/uL (ref 140–400)
RBC: 4.22 10*6/uL (ref 4.20–5.82)
RDW: 12.8 % (ref 11.0–14.6)
WBC: 3.9 10*3/uL — ABNORMAL LOW (ref 4.0–10.3)

## 2013-09-19 LAB — COMPREHENSIVE METABOLIC PANEL (CC13)
ALBUMIN: 4.1 g/dL (ref 3.5–5.0)
ALK PHOS: 74 U/L (ref 40–150)
ALT: 20 U/L (ref 0–55)
ANION GAP: 13 meq/L — AB (ref 3–11)
AST: 23 U/L (ref 5–34)
BUN: 19.5 mg/dL (ref 7.0–26.0)
CHLORIDE: 105 meq/L (ref 98–109)
CO2: 24 mEq/L (ref 22–29)
CREATININE: 1.2 mg/dL (ref 0.7–1.3)
Calcium: 9.8 mg/dL (ref 8.4–10.4)
Glucose: 109 mg/dl (ref 70–140)
POTASSIUM: 4 meq/L (ref 3.5–5.1)
Sodium: 142 mEq/L (ref 136–145)
TOTAL PROTEIN: 7.6 g/dL (ref 6.4–8.3)
Total Bilirubin: 0.49 mg/dL (ref 0.20–1.20)

## 2013-09-19 LAB — LACTATE DEHYDROGENASE (CC13): LDH: 181 U/L (ref 125–245)

## 2013-09-19 MED ORDER — ZOLPIDEM TARTRATE 10 MG PO TABS: 10.0000 mg | ORAL_TABLET | Freq: Every evening | ORAL | Status: DC | PRN

## 2013-09-19 NOTE — Progress Notes (Signed)
Hematology and Oncology Follow Up Visit  Jeff [name] [id number] 20-Jun-1965 48 y.o. 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   Prior Therapy:  1. Status post orchiectomy done in July 2010. 2. The patient received 2 cycles adjuvant BEP for stage IB disease.  Therapy concluded in September 2010.  He had no evidence of any residual disease and continues to be in remission at this time. 3. 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  Interim History:  Jeff [name] presents today for a followup visit. He is a nice gentleman with the above history.  He has continued to do very well without any evidence to suggest recurrent disease.  He had not reported any abdominal pain, had not reported any major appetite changes. He has stopped taking testosterone supplements and had not really reported any major changes in his activity level or performance status. Did not report any back pain.  Did not report any early satiety or lymphadenopathy. He did have an episode of weakness and numbness in his lower extremities. Was evaluated in the emergency department in January 2015 and likely was diagnosed with anxiety. Since that time, he had not had any recent episodes.  Medications: I have reviewed the patient's current medications.  Current Outpatient Prescriptions  Medication Sig Dispense Refill  . buPROPion (WELLBUTRIN XL) 300 MG 24 hr tablet Take 300 mg by mouth daily.        Marland Kitchen LORazepam (ATIVAN) 1 MG tablet Take 1 tablet (1 mg total) by mouth 3 (three) times daily as needed for anxiety.  15 tablet  0  . testosterone cypionate (DEPOTESTOTERONE CYPIONATE) 200 MG/ML injection Inject 200 mg into the muscle every 14 (fourteen) days.       Marland Kitchen zolpidem (AMBIEN) 10 MG tablet Take 1 tablet (10 mg  total) by mouth at bedtime as needed. Script was faxed [facility] 05/24/12  30 tablet  0   No current facility-administered medications for this visit.    Allergies:  Allergies  Allergen Reactions  . Penicillins     Childhood allergy     Past Medical History, Surgical history, Social history, and Family History were reviewed and updated.  Review of Systems:  Remaining ROS negative. Physical Exam: Blood pressure 160/97, pulse 112, temperature 97 F (36.1 C), temperature source Oral, resp. rate 18, weight 207 lb 7 oz (94.093 kg). ECOG: 0 General appearance: alert and awake not in any distress. Head: Normocephalic, without obvious abnormality, atraumatic Neck: no adenopathy, no carotid bruit, no JVD, supple, symmetrical, trachea midline and thyroid not enlarged, symmetric, no tenderness/mass/nodules Lymph nodes: Cervical, supraclavicular, and axillary nodes normal. Heart:regular rate and rhythm, S1, S2 normal, no murmur, click, rub or gallop Lung:chest clear, no wheezing, rales, normal symmetric air entry Abdomin: soft, non-tender, without masses or organomegaly EXT:no erythema, induration, or nodules   Lab Results: Lab Results  Component Value Date   WBC 3.9* 09/19/2013   HGB 13.2 09/19/2013   HCT 39.1 09/19/2013   MCV 92.7 09/19/2013   PLT 204 09/19/2013     Chemistry      Component Value Date/Time   NA 136* 05/19/2013 1430   NA 137 01/16/2013 1543   K 4.3 05/19/2013 1430   K 4.2 01/16/2013 1543   CL  100 05/19/2013 1430   CL 102 07/11/2012 1523   CO2 24 05/19/2013 1430   CO2 20* 01/16/2013 1543   BUN 23 05/19/2013 1430   BUN 18.8 01/16/2013 1543   CREATININE 1.06 05/19/2013 1430   CREATININE 1.1 01/16/2013 1543      Component Value Date/Time   CALCIUM 9.1 05/19/2013 1430   CALCIUM 9.3 01/16/2013 1543   ALKPHOS 71 01/16/2013 1543   ALKPHOS 69 12/02/2011 1311   AST 26 01/16/2013 1543   AST 27 12/02/2011 1311   ALT 30 01/16/2013 1543   ALT 27 12/02/2011 1311   BILITOT 0.78 01/16/2013 1543    BILITOT 0.5 12/02/2011 1311         Impression and Plan:  48 year old gentleman with the following issues: 1. Stage IB nonseminomatous testes cancer status post orchiectomy followed by 2 cycles of BEP.  No evidence of any recurrent disease at the time being. CT scan on june 2012 did not show any disease.  We will continue on active surveillance at the time being, have him come back in 6 months' time and repeat physical examination as well as laboratory check and repeat imaging studies as needed. 2. Testosterone deficiency currently stopped his testosterone supplements and feels relatively well. 3. Hypertension.  Follow up with his PCP for that.   4. Questionable anxiety: His following up with his PCP regarding that.    Jeff Portela, MD 5/6/20153:33 PM

## 2013-09-19 NOTE — Telephone Encounter (Signed)
Gave pt appt for lab and for November 2015

## 2013-09-22 LAB — AFP TUMOR MARKER: AFP-Tumor Marker: 2.6 ng/mL (ref 0.0–8.0)

## 2013-09-22 LAB — BETA HCG QUANT (REF LAB): Beta hCG, Tumor Marker: <2 m[IU]/mL (ref <5.0)

## 2014-02-13 ENCOUNTER — Telehealth: Payer: Self-pay | Admitting: Oncology

## 2014-02-13 NOTE — Telephone Encounter (Signed)
Lvm advising appt time chg from 2.30p to 10am due to md on call. Mailed revised appt calendar.

## 2014-03-14 ENCOUNTER — Telehealth: Payer: Self-pay | Admitting: Oncology

## 2014-03-14 NOTE — Telephone Encounter (Signed)
Lvm advisng appt chg from 11/17 (md pal) to 11/16 @ 10am.

## 2014-04-01 ENCOUNTER — Other Ambulatory Visit: Payer: 59

## 2014-04-01 ENCOUNTER — Ambulatory Visit: Payer: 59 | Admitting: Oncology

## 2014-04-02 ENCOUNTER — Ambulatory Visit: Payer: 59 | Admitting: Oncology

## 2014-04-02 ENCOUNTER — Other Ambulatory Visit: Payer: 59

## 2015-06-20 MED FILL — ZOLPIDEM TARTRATE 10 MG TAB: 10 | 30 days supply | Qty: 30 | Fill #0

## 2015-07-14 DIAGNOSIS — H524 Presbyopia: Secondary | ICD-10-CM | POA: Diagnosis not present

## 2015-07-14 DIAGNOSIS — H52223 Regular astigmatism, bilateral: Secondary | ICD-10-CM | POA: Diagnosis not present

## 2015-07-14 DIAGNOSIS — H5201 Hypermetropia, right eye: Secondary | ICD-10-CM | POA: Diagnosis not present

## 2015-07-15 MED FILL — DEPO-TESTOSTERONE 200 MG/ML: 200 | 84 days supply | Qty: 3 | Fill #1

## 2015-08-12 MED FILL — LORazepam 1 MG TABS: 1 | 15 days supply | Qty: 30 | Fill #0

## 2015-08-26 MED FILL — AMLODIPINE BESYLATE 5 MG TA: 5 | 90 days supply | Qty: 90 | Fill #0

## 2015-10-22 DIAGNOSIS — F410 Panic disorder [episodic paroxysmal anxiety] without agoraphobia: Secondary | ICD-10-CM | POA: Diagnosis not present

## 2015-10-22 DIAGNOSIS — Z79899 Other long term (current) drug therapy: Secondary | ICD-10-CM | POA: Diagnosis not present

## 2015-10-22 DIAGNOSIS — G4700 Insomnia, unspecified: Secondary | ICD-10-CM | POA: Diagnosis not present

## 2015-10-22 DIAGNOSIS — E291 Testicular hypofunction: Secondary | ICD-10-CM | POA: Diagnosis not present

## 2015-10-24 MED FILL — DEPO-TESTOSTERONE 200 MG/ML: 200 | 28 days supply | Qty: 1 | Fill #0

## 2015-11-24 MED FILL — DEPO-TESTOSTERONE 200 MG/ML: 200 | 90 days supply | Qty: 3 | Fill #0

## 2015-12-22 MED FILL — BUPROPION HCL XL 300 MG TAB: 300 | 90 days supply | Qty: 180 | Fill #0

## 2015-12-22 MED FILL — AMLODIPINE BESYLATE 5 MG TA: 5 | 90 days supply | Qty: 90 | Fill #0

## 2015-12-31 DIAGNOSIS — R079 Chest pain, unspecified: Secondary | ICD-10-CM | POA: Diagnosis not present

## 2015-12-31 DIAGNOSIS — Z8249 Family history of ischemic heart disease and other diseases of the circulatory system: Secondary | ICD-10-CM | POA: Diagnosis not present

## 2015-12-31 DIAGNOSIS — Z13220 Encounter for screening for lipoid disorders: Secondary | ICD-10-CM | POA: Diagnosis not present

## 2015-12-31 DIAGNOSIS — K209 Esophagitis, unspecified: Secondary | ICD-10-CM | POA: Diagnosis not present

## 2015-12-31 MED FILL — CARAFATE 1 GM/10 ML SUSP: 1 | 15 days supply | Qty: 300 | Fill #0

## 2015-12-31 MED FILL — raNITIdine HCL 300 MG TABS: 300 | 30 days supply | Qty: 30 | Fill #0

## 2016-03-05 MED FILL — DEPO-TESTOSTERONE 200 MG/ML: 200 | 90 days supply | Qty: 3 | Fill #1

## 2016-05-05 DIAGNOSIS — R05 Cough: Secondary | ICD-10-CM | POA: Diagnosis not present

## 2016-05-05 MED FILL — AZITHROMYCIN 250 MG TABLET: 250 | 5 days supply | Qty: 6 | Fill #0

## 2016-05-05 MED FILL — HYDROCODONE-CHLORPHENIRAM S: 10-8 | 9 days supply | Qty: 90 | Fill #0

## 2016-06-09 DIAGNOSIS — H524 Presbyopia: Secondary | ICD-10-CM | POA: Diagnosis not present

## 2016-06-09 DIAGNOSIS — H52223 Regular astigmatism, bilateral: Secondary | ICD-10-CM | POA: Diagnosis not present

## 2016-06-30 MED FILL — DEPO-TESTOSTERONE 200 MG/ML: 200 | 90 days supply | Qty: 3 | Fill #0

## 2016-07-01 MED FILL — AMLODIPINE BESYLATE 5 MG TA: 5 | 90 days supply | Qty: 90 | Fill #1

## 2016-07-19 DIAGNOSIS — E291 Testicular hypofunction: Secondary | ICD-10-CM | POA: Diagnosis not present

## 2016-07-19 DIAGNOSIS — F410 Panic disorder [episodic paroxysmal anxiety] without agoraphobia: Secondary | ICD-10-CM | POA: Diagnosis not present

## 2016-07-19 DIAGNOSIS — R079 Chest pain, unspecified: Secondary | ICD-10-CM | POA: Diagnosis not present

## 2016-07-22 MED FILL — LORazepam 1 MG TABS: 1 | 30 days supply | Qty: 30 | Fill #0

## 2016-10-14 MED FILL — TESTOSTERONE CYP 200 MG/ML: 200 | 84 days supply | Qty: 3 | Fill #0

## 2016-10-25 MED FILL — BUPROPION HCL XL 300 MG TAB: 300 | 90 days supply | Qty: 180 | Fill #1

## 2016-11-16 MED FILL — AMLODIPINE BESYLATE 5 MG TA: 5 | 90 days supply | Qty: 90 | Fill #0

## 2016-11-24 MED FILL — ZOLPIDEM TARTRATE 10 MG TAB: 10 | 30 days supply | Qty: 30 | Fill #0

## 2017-01-11 MED FILL — LORazepam 1 MG TABS: 1 | 30 days supply | Qty: 30 | Fill #0

## 2017-01-21 MED FILL — TESTOSTERONE CYP 200 MG/ML: 200 | 21 days supply | Qty: 3 | Fill #0

## 2017-03-03 MED FILL — LORazepam 1 MG TABS: 1 | 15 days supply | Qty: 30 | Fill #0

## 2017-04-05 MED FILL — LORazepam 1 MG TABS: 1 | 30 days supply | Qty: 30 | Fill #0

## 2017-05-02 MED FILL — AMLODIPINE BESYLATE 5 MG TA: 5 | 90 days supply | Qty: 90 | Fill #1

## 2017-06-13 MED FILL — CITALOPRAM HBR 20 MG TABLET: 20 | 30 days supply | Qty: 30 | Fill #0

## 2017-06-22 MED FILL — TESTOSTERONE CYP 200 MG/ML: 200 | 21 days supply | Qty: 3 | Fill #0

## 2017-06-22 MED FILL — BD NEEDLES 22GX1.5: 22G X 1-1/2 | 25 days supply | Qty: 25 | Fill #0

## 2017-06-22 MED FILL — BD 3 ML SYRINGE WITH NEEDLE: 22G X 1-1/2 | 30 days supply | Qty: 25 | Fill #0

## 2017-06-22 MED FILL — LORazepam 1 MG TABS: 1 | 18 days supply | Qty: 35 | Fill #0

## 2017-06-22 MED FILL — BD NEEDLES 22GX1.5": 22G X 1-1/2 | 25 days supply | Qty: 25 | Fill #0

## 2017-06-22 MED FILL — ZOLPIDEM TARTRATE 10 MG TAB: 10 | 30 days supply | Qty: 30 | Fill #0

## 2017-08-24 MED FILL — AMLODIPINE BESYLATE 5 MG TA: 5 | 90 days supply | Qty: 90 | Fill #0

## 2017-08-26 MED FILL — TESTOSTERONE CYP 200 MG/ML: 200 | 21 days supply | Qty: 3 | Fill #1

## 2017-11-08 MED FILL — TESTOSTERONE CYP 200 MG/ML: 200 | 21 days supply | Qty: 3 | Fill #0

## 2017-11-08 MED FILL — LORazepam 1 MG TABS: 1 | 18 days supply | Qty: 35 | Fill #1

## 2018-01-03 MED FILL — AMLODIPINE BESYLATE 5 MG TA: 5 | 90 days supply | Qty: 90 | Fill #1

## 2018-01-31 MED FILL — TESTOSTERONE CYP 200 MG/ML: 200 | 21 days supply | Qty: 3 | Fill #1

## 2018-02-01 MED FILL — BuPROPion HCL ER (XL) 300 M: 300 | 90 days supply | Qty: 90 | Fill #0

## 2018-02-24 ENCOUNTER — Telehealth: Payer: Self-pay

## 2018-02-24 NOTE — Telephone Encounter (Signed)
Called pt and left message for pt to call back to get updated info for medical and family hx.

## 2018-02-24 NOTE — Telephone Encounter (Signed)
Attempted to call pt back; left another message for pt to call back to chart prep.

## 2018-03-09 MED FILL — TESTOSTERONE CYP 200 MG/ML: 200 | 21 days supply | Qty: 3 | Fill #0

## 2018-03-13 ENCOUNTER — Encounter (INDEPENDENT_AMBULATORY_CARE_PROVIDER_SITE_OTHER): Payer: Self-pay

## 2018-03-13 ENCOUNTER — Encounter: Payer: Self-pay | Admitting: Cardiology

## 2018-03-13 ENCOUNTER — Ambulatory Visit: Payer: No Typology Code available for payment source | Admitting: Cardiology

## 2018-03-13 VITALS — BP 132/90 | HR 69 | Ht 71.0 in | Wt 215.2 lb

## 2018-03-13 DIAGNOSIS — I10 Essential (primary) hypertension: Secondary | ICD-10-CM | POA: Diagnosis not present

## 2018-03-13 DIAGNOSIS — I493 Ventricular premature depolarization: Secondary | ICD-10-CM | POA: Diagnosis not present

## 2018-03-13 DIAGNOSIS — R0789 Other chest pain: Secondary | ICD-10-CM | POA: Diagnosis not present

## 2018-03-13 DIAGNOSIS — F419 Anxiety disorder, unspecified: Secondary | ICD-10-CM | POA: Diagnosis not present

## 2018-03-13 NOTE — Progress Notes (Signed)
Cardiology Office Note:    Date:  03/13/2018   ID:  Jeff [name], DOB 05-Nov-1965, MRN [id number]  PCP:  Lawerance Cruel, MD  Cardiologist:  No primary care provider on file.  Electrophysiologist:  None   Referring MD: Lawerance Cruel, MD     History of Present Illness:    Jeff [name] is a 52 y.o. male, goes by Jeff [name], here for evaluation of chest pain with family history at the request of Dr. Harrington Challenger.  Felt chest pressure, Friday, Sudden, non exertional, no syncope.  Does not know if this is associated with anxiety or stress.  He does not feel that usually when walking up hills or up stairs.  Very rare tobacco use, socially.   Mother diagnosed with coronary artery disease alive in her 17s, brother has diabetes.  Non-smoker.  Being treated for hypertension with amlodipine.  Also has had anxiety/panic.  He has also been treated in the past for stage Ib testicular cancer.  Post orchiectomy.  Creatinine 1.1 hemoglobin 16.1 LDL 110 HDL 52 triglycerides 164.  Past Medical History:  Diagnosis Date  . Chest pain   . HTN (hypertension)   . Insomnia   . Panic disorder   . recurrent testicullar ca dx'd lt 1990; rt 2010  . Testicular cancer [facility])     Past Surgical History:  Procedure Laterality Date  . SURGERY SCROTAL / TESTICULAR  2011    Current Medications: Current Meds  Medication Sig  . buPROPion (WELLBUTRIN XL) 300 MG 24 hr tablet Take 300 mg by mouth daily.    Marland Kitchen LORazepam (ATIVAN) 1 MG tablet Take 1 tablet (1 mg total) by mouth 3 (three) times daily as needed for anxiety.  Marland Kitchen testosterone cypionate (DEPOTESTOTERONE CYPIONATE) 200 MG/ML injection Inject 200 mg into the muscle every 14 (fourteen) days.   Marland Kitchen zolpidem (AMBIEN) 10 MG tablet Take 1 tablet (10 mg total) by mouth at bedtime as needed. Script was faxed to [facility] 05/24/12     Allergies:   Penicillins   Social History   Socioeconomic History  . Marital status: Single    Spouse name: Not on file    . Number of children: Not on file  . Years of education: Not on file  . Highest education level: Not on file  Occupational History  . Not on file  Social Needs  . Financial resource strain: Not on file  . Food insecurity:    Worry: Not on file    Inability: Not on file  . Transportation needs:    Medical: Not on file    Non-medical: Not on file  Tobacco Use  . Smoking status: Current Some Day Smoker  . Smokeless tobacco: Never Used  Substance and Sexual Activity  . Alcohol use: Yes  . Drug use: No  . Sexual activity: Not on file  Lifestyle  . Physical activity:    Days per week: Not on file    Minutes per session: Not on file  . Stress: Not on file  Relationships  . Social connections:    Talks on phone: Not on file    Gets together: Not on file    Attends religious service: Not on file    Active member of club or organization: Not on file    Attends meetings of clubs or organizations: Not on file    Relationship status: Not on file  Other Topics Concern  . Not on file  Social History Narrative  .  Not on file     Family History: The patient's family history includes Breast cancer in his paternal grandmother; Diabetes in his brother; Heart disease in his maternal grandfather, maternal grandmother, and mother.  ROS:   Please see the history of present illness.    Denies any fevers chills nausea vomiting syncope bleeding orthopnea, all other systems reviewed and are negative.  EKGs/Labs/Other Studies Reviewed:    The following studies were reviewed today: Higher office notes, EKG, lab work  EKG:  EKG is  ordered today.  The ekg ordered today demonstrates 03/13/2018, sinus rhythm 69, PVC no other abnormalities.  His PVC was asymptomatic.  Recent Labs: No results found for requested labs within last 8760 hours.  Recent Lipid Panel No results found for: CHOL, TRIG, HDL, CHOLHDL, VLDL, LDLCALC, LDLDIRECT  Physical Exam:    VS:  BP 132/90   Pulse 69   Ht 5\' 11"   (1.803 m)   Wt 215 lb 3.2 oz (97.6 kg)   SpO2 96%   BMI 30.01 kg/m     Wt Readings from Last 3 Encounters:  03/13/18 215 lb 3.2 oz (97.6 kg)  09/19/13 207 lb 7 oz (94.1 kg)  01/16/13 234 lb 1.6 oz (106.2 kg)     GEN:  Well nourished, well developed in no acute distress HEENT: Normal NECK: No JVD; No carotid bruits LYMPHATICS: No lymphadenopathy CARDIAC: RRR, no murmurs, rubs, gallops RESPIRATORY:  Clear to auscultation without rales, wheezing or rhonchi  ABDOMEN: Soft, non-tender, non-distended MUSCULOSKELETAL:  No edema; No deformity  SKIN: Warm and dry NEUROLOGIC:  Alert and oriented x 3 PSYCHIATRIC:  Normal affect   ASSESSMENT:    1. Chest pressure   2. PVC (premature ventricular contraction)   3. Essential hypertension   4. Anxiety    PLAN:    In order of problems listed above:  Chest discomfort - Could be associated with anxiety or stress/musculoskeletal tension.  EKG unremarkable.  We will go ahead and proceed with exercise treadmill test.  Note, he is on testosterone supplementation given his prior orchiectomy.  PVC -Noted on ECG.  Benign.  Asymptomatic.  Explained.  Essential hypertension - Blood pressure under good control.  No changes made.  Amlodipine.  Anxiety - Takes occasional lorazepam.  On Wellbutrin.,  Dr. Harrington Challenger.   Medication Adjustments/Labs and Tests Ordered: Current medicines are reviewed at length with the patient today.  Concerns regarding medicines are outlined above.  Orders Placed This Encounter  Procedures  . EXERCISE TOLERANCE TEST (ETT)  . EKG 12-Lead   No orders of the defined types were placed in this encounter.   Patient Instructions  Medication Instructions:  The current medical regimen is effective;  continue present plan and medications.  Testing/Procedures: Your physician has requested that you have an exercise tolerance test. For further information please visit [email or web address]. Please also follow instruction sheet,  as given.  Follow-Up: Follow up as needed after the above testing.  Thank you for choosing [facility]!!        Signed, Candee Furbish, MD  03/13/2018 11:55 AM    [place]

## 2018-03-13 NOTE — Patient Instructions (Signed)
Medication Instructions:  The current medical regimen is effective;  continue present plan and medications.  Testing/Procedures: Your physician has requested that you have an exercise tolerance test. For further information please visit [email or web address]. Please also follow instruction sheet, as given.  Follow-Up: Follow up as needed after the above testing.  Thank you for choosing [facility]!!

## 2018-04-18 ENCOUNTER — Ambulatory Visit (INDEPENDENT_AMBULATORY_CARE_PROVIDER_SITE_OTHER): Payer: No Typology Code available for payment source

## 2018-04-18 ENCOUNTER — Encounter (INDEPENDENT_AMBULATORY_CARE_PROVIDER_SITE_OTHER): Payer: Self-pay

## 2018-04-18 DIAGNOSIS — R0789 Other chest pain: Secondary | ICD-10-CM | POA: Diagnosis not present

## 2018-04-18 LAB — EXERCISE TOLERANCE TEST
CHL CUP MPHR: 166 {beats}/min
CSEPEDS: 40 s
CSEPEW: 13.4 METS
CSEPPHR: 139 {beats}/min
Exercise duration (min): 11 min
Percent HR: 83 %
RPE: 15
Rest HR: 66 {beats}/min

## 2018-04-19 MED FILL — AMLODIPINE BESYLATE 5 MG TA: 5 | 90 days supply | Qty: 90 | Fill #2

## 2018-04-20 MED FILL — TESTOSTERONE CYP 200 MG/ML: 200 | 21 days supply | Qty: 3 | Fill #0

## 2018-04-20 MED FILL — LORazepam 1 MG TABS: 1 | 30 days supply | Qty: 35 | Fill #0

## 2018-05-01 MED FILL — ZOLPIDEM TARTRATE 10 MG TAB: 10 | 30 days supply | Qty: 30 | Fill #0

## 2018-07-19 MED FILL — LORazepam 1 MG TABS: 1 | 18 days supply | Qty: 35 | Fill #0

## 2018-07-25 DIAGNOSIS — I10 Essential (primary) hypertension: Secondary | ICD-10-CM | POA: Diagnosis not present

## 2018-07-25 DIAGNOSIS — E291 Testicular hypofunction: Secondary | ICD-10-CM | POA: Diagnosis not present

## 2018-07-25 DIAGNOSIS — F3341 Major depressive disorder, recurrent, in partial remission: Secondary | ICD-10-CM | POA: Diagnosis not present

## 2018-07-25 DIAGNOSIS — F411 Generalized anxiety disorder: Secondary | ICD-10-CM | POA: Diagnosis not present

## 2018-07-25 DIAGNOSIS — G4700 Insomnia, unspecified: Secondary | ICD-10-CM | POA: Diagnosis not present

## 2018-07-25 MED FILL — DESVENLAFAXINE SUC ER 25 MG: 25 | 30 days supply | Qty: 60 | Fill #0

## 2018-07-29 MED FILL — ZOLPIDEM TARTRATE 10 MG TAB: 10 | 90 days supply | Qty: 30 | Fill #0

## 2018-08-11 DIAGNOSIS — Z209 Contact with and (suspected) exposure to unspecified communicable disease: Secondary | ICD-10-CM | POA: Diagnosis not present

## 2018-08-22 MED FILL — AMLODIPINE BESYLATE 5 MG TA: 5 | 90 days supply | Qty: 90 | Fill #0

## 2018-09-06 DIAGNOSIS — L820 Inflamed seborrheic keratosis: Secondary | ICD-10-CM | POA: Diagnosis not present

## 2018-10-05 MED FILL — DESVENLAFAXINE SUCCINATE ER: 25 | 30 days supply | Qty: 60 | Fill #0

## 2018-11-27 MED FILL — AMLODIPINE BESYLATE 5 MG TA: 5 | 90 days supply | Qty: 90 | Fill #0

## 2019-01-10 MED FILL — TESTOSTERONE CYP 200 MG/ML: 200 | 21 days supply | Qty: 3 | Fill #0

## 2019-01-10 MED FILL — LORAZEPAM 1 MG TABS: 1 | 17 days supply | Qty: 35 | Fill #0

## 2019-01-24 DIAGNOSIS — H0012 Chalazion right lower eyelid: Secondary | ICD-10-CM | POA: Diagnosis not present

## 2019-02-07 MED FILL — LORAZEPAM 1 MG TABS: 1 | 17 days supply | Qty: 35 | Fill #1

## 2019-03-12 DIAGNOSIS — R079 Chest pain, unspecified: Secondary | ICD-10-CM | POA: Diagnosis not present

## 2019-03-15 DIAGNOSIS — G4700 Insomnia, unspecified: Secondary | ICD-10-CM | POA: Diagnosis not present

## 2019-03-15 DIAGNOSIS — F3341 Major depressive disorder, recurrent, in partial remission: Secondary | ICD-10-CM | POA: Diagnosis not present

## 2019-03-15 DIAGNOSIS — E782 Mixed hyperlipidemia: Secondary | ICD-10-CM | POA: Diagnosis not present

## 2019-03-15 DIAGNOSIS — Z79899 Other long term (current) drug therapy: Secondary | ICD-10-CM | POA: Diagnosis not present

## 2019-03-15 DIAGNOSIS — Z125 Encounter for screening for malignant neoplasm of prostate: Secondary | ICD-10-CM | POA: Diagnosis not present

## 2019-03-15 DIAGNOSIS — F410 Panic disorder [episodic paroxysmal anxiety] without agoraphobia: Secondary | ICD-10-CM | POA: Diagnosis not present

## 2019-03-15 DIAGNOSIS — E291 Testicular hypofunction: Secondary | ICD-10-CM | POA: Diagnosis not present

## 2019-03-15 DIAGNOSIS — I10 Essential (primary) hypertension: Secondary | ICD-10-CM | POA: Diagnosis not present

## 2019-03-15 MED FILL — DESVENLAFAXINE SUC ER 50 MG: 50 | 90 days supply | Qty: 90 | Fill #0

## 2019-03-15 MED FILL — LORAZEPAM 1 MG TABS: 1 | 22 days supply | Qty: 45 | Fill #0

## 2019-03-15 MED FILL — AMLODIPINE BESYLATE 5 MG TA: 5 | 90 days supply | Qty: 90 | Fill #0

## 2019-03-26 MED FILL — TESTOSTERONE CYP 200 MG/ML: 200 | 21 days supply | Qty: 3 | Fill #1

## 2019-03-28 MED FILL — LORAZEPAM 1 MG TABS: 1 | 22 days supply | Qty: 45 | Fill #0

## 2019-03-28 MED FILL — AMLODIPINE BESYLATE 5 MG TA: 5 | 90 days supply | Qty: 90 | Fill #0

## 2019-03-28 MED FILL — DESVENLAFAXINE SUC ER 50 MG: 50 | 90 days supply | Qty: 90 | Fill #0

## 2019-05-09 MED FILL — LORAZEPAM 1 MG TABS: 1 | 22 days supply | Qty: 45 | Fill #1

## 2019-06-28 MED FILL — AMLODIPINE BESYLATE 5 MG TA: 5 | 90 days supply | Qty: 90 | Fill #1

## 2019-06-28 MED FILL — LORAZEPAM 1 MG TABS: 1 | 22 days supply | Qty: 45 | Fill #2

## 2019-07-11 MED FILL — ZOLPIDEM TARTRATE 10 MG TAB: 10 | 90 days supply | Qty: 30 | Fill #0

## 2019-07-23 ENCOUNTER — Ambulatory Visit: Payer: No Typology Code available for payment source | Attending: Internal Medicine

## 2019-07-23 DIAGNOSIS — Z20822 Contact with and (suspected) exposure to covid-19: Secondary | ICD-10-CM

## 2019-07-24 LAB — NOVEL CORONAVIRUS, NAA: SARS-CoV-2, NAA: NOT DETECTED

## 2019-07-27 DIAGNOSIS — M545 Low back pain: Secondary | ICD-10-CM | POA: Diagnosis not present

## 2019-08-13 MED FILL — LORAZEPAM 1 MG TABS: 1 | 30 days supply | Qty: 35 | Fill #0

## 2019-08-27 ENCOUNTER — Emergency Department (HOSPITAL_COMMUNITY): Payer: 59

## 2019-08-27 ENCOUNTER — Other Ambulatory Visit: Payer: Self-pay

## 2019-08-27 ENCOUNTER — Ambulatory Visit (HOSPITAL_COMMUNITY)
Admission: EM | Admit: 2019-08-27 | Discharge: 2019-08-27 | Disposition: A | Discharge status: Home / Self Care | Payer: 59 | Source: Home / Self Care

## 2019-08-27 ENCOUNTER — Emergency Department (HOSPITAL_COMMUNITY)
Admission: EM | Admit: 2019-08-27 | Discharge: 2019-08-27 | Disposition: A | Discharge status: Home / Self Care | Payer: 59 | Attending: Emergency Medicine | Admitting: Emergency Medicine

## 2019-08-27 ENCOUNTER — Encounter (HOSPITAL_COMMUNITY): Payer: Self-pay | Admitting: *Deleted

## 2019-08-27 DIAGNOSIS — F17200 Nicotine dependence, unspecified, uncomplicated: Secondary | ICD-10-CM | POA: Diagnosis not present

## 2019-08-27 DIAGNOSIS — Z8547 Personal history of malignant neoplasm of testis: Secondary | ICD-10-CM | POA: Insufficient documentation

## 2019-08-27 DIAGNOSIS — R42 Dizziness and giddiness: Secondary | ICD-10-CM | POA: Diagnosis not present

## 2019-08-27 DIAGNOSIS — R1031 Right lower quadrant pain: Secondary | ICD-10-CM | POA: Diagnosis not present

## 2019-08-27 DIAGNOSIS — R202 Paresthesia of skin: Secondary | ICD-10-CM | POA: Insufficient documentation

## 2019-08-27 DIAGNOSIS — R0789 Other chest pain: Secondary | ICD-10-CM | POA: Diagnosis not present

## 2019-08-27 DIAGNOSIS — Z79899 Other long term (current) drug therapy: Secondary | ICD-10-CM | POA: Insufficient documentation

## 2019-08-27 DIAGNOSIS — F419 Anxiety disorder, unspecified: Secondary | ICD-10-CM | POA: Insufficient documentation

## 2019-08-27 DIAGNOSIS — I10 Essential (primary) hypertension: Secondary | ICD-10-CM | POA: Diagnosis not present

## 2019-08-27 DIAGNOSIS — R079 Chest pain, unspecified: Secondary | ICD-10-CM | POA: Diagnosis not present

## 2019-08-27 LAB — TROPONIN I (HIGH SENSITIVITY)
Troponin I (High Sensitivity): 6 ng/L (ref <18)
Troponin I (High Sensitivity): 6 ng/L (ref <18)

## 2019-08-27 LAB — BASIC METABOLIC PANEL
Anion gap: 11 (ref 5–15)
BUN: 14 mg/dL (ref 6–20)
CO2: 23 mmol/L (ref 22–32)
Calcium: 8.8 mg/dL — ABNORMAL LOW (ref 8.9–10.3)
Chloride: 101 mmol/L (ref 98–111)
Creatinine, Ser: 0.94 mg/dL (ref 0.61–1.24)
GFR calc Af Amer: >60 mL/min (ref >60)
GFR calc non Af Amer: >60 mL/min (ref >60)
Glucose, Bld: 99 mg/dL (ref 70–99)
Potassium: 3.9 mmol/L (ref 3.5–5.1)
Sodium: 135 mmol/L (ref 135–145)

## 2019-08-27 LAB — CBC
HCT: 43.6 % (ref 39.0–52.0)
Hemoglobin: 14.5 g/dL (ref 13.0–17.0)
MCH: 31.5 pg (ref 26.0–34.0)
MCHC: 33.3 g/dL (ref 30.0–36.0)
MCV: 94.8 fL (ref 80.0–100.0)
Platelets: 243 10*3/uL (ref 150–400)
RBC: 4.6 MIL/uL (ref 4.22–5.81)
RDW: 13.6 % (ref 11.5–15.5)
WBC: 5.4 10*3/uL (ref 4.0–10.5)
nRBC: 0 % (ref 0.0–0.2)

## 2019-08-27 MED ORDER — SODIUM CHLORIDE 0.9% FLUSH: 3.0000 mL | Freq: Once | INTRAVENOUS | Status: DC

## 2019-08-27 NOTE — ED Provider Notes (Signed)
[facility] EMERGENCY DEPARTMENT Provider Note   CSN: [id number] Arrival date & time: 08/27/19  [id number]     History Chief Complaint  Patient presents with  . Hypertension  . Dizziness  . Chest Pain    BRIAM KNUCKLES is a 54 y.o. male.  HPI HPI Comments: MARSHUN FULMORE is a 54 y.o. male with a history of anxiety and recurrent testicular cancer who presents to the Emergency Department complaining of hypertension.  Patient notes a history of hypertension is on 5 mg of Norvasc which he states he is compliant with.  This morning he woke up and measured his blood pressure which was "170 something over 100 something".  He takes 1 mg Ativan as needed for panic attacks which she states he took this morning with mild relief his symptoms.  He was also initially experiencing tingling in his left upper extremity as well as an episode of chest tightness, lightheadedness, moderate tinnitus in his left ear which is gradually alleviating.  He is a social smoker.  He denies any drug use.  He was drinking alcohol with a friend last night and states he did drink in excess last night.  He denies fevers, chills, URI symptoms, chest pain, shortness of breath, abdominal pain, nausea, vomiting, diarrhea, urinary changes, penile pain, scrotal pain, penile discharge, syncope.  Patient also complains of 2 weeks of mild intermittent right inguinal pain.  He states it typically presents gradually with movement and also gradually alleviates with rest.      Past Medical History:  Diagnosis Date  . Chest pain   . HTN (hypertension)   . Insomnia   . Panic disorder   . recurrent testicullar ca dx'd lt 1990; rt 2010  . Testicular cancer [facility])     Patient Active Problem List   Diagnosis Date Noted  . Testis cancer ([place]) 09/19/2013    Past Surgical History:  Procedure Laterality Date  . SURGERY SCROTAL / TESTICULAR  2011       Family History  Problem Relation Age of Onset  . Heart disease  Mother   . Diabetes Brother   . Heart disease Maternal Grandmother   . Heart disease Maternal Grandfather   . Breast cancer Paternal Grandmother     Social History   Tobacco Use  . Smoking status: Current Some Day Smoker  . Smokeless tobacco: Never Used  Substance Use Topics  . Alcohol use: Yes  . Drug use: No    Home Medications Prior to Admission medications   Medication Sig Start Date End Date Taking? Authorizing Provider  buPROPion (WELLBUTRIN XL) 300 MG 24 hr tablet Take 300 mg by mouth daily.      [provider]  LORazepam (ATIVAN) 1 MG tablet Take 1 tablet (1 mg total) by mouth 3 (three) times daily as needed for anxiety. 05/19/13   Tanna Furry, MD  testosterone cypionate (DEPOTESTOTERONE CYPIONATE) 200 MG/ML injection Inject 200 mg into the muscle every 14 (fourteen) days.     [provider]  zolpidem (AMBIEN) 10 MG tablet Take 1 tablet (10 mg total) by mouth at bedtime as needed. Script was faxed [facility] 05/24/12 09/19/13   Wyatt Portela, MD    Allergies    Penicillins  Review of Systems   Review of Systems  All other systems reviewed and are negative. Ten systems reviewed and are negative for acute change, except as noted in the HPI.   Physical Exam Updated Vital Signs  BP (!) 144/98   Pulse 78   Temp 98.3 F (36.8 C) (Oral)   Resp 17   Ht 6\' 1"  (1.854 m)   Wt 97.5 kg   SpO2 97%   BMI 28.37 kg/m   Physical Exam Vitals and nursing note reviewed.  Constitutional:      General: He is not in acute distress.    Appearance: He is well-developed and normal weight. He is not ill-appearing, toxic-appearing or diaphoretic.  HENT:     Head: Normocephalic and atraumatic.  Eyes:     Extraocular Movements: Extraocular movements intact.     Pupils: Pupils are equal, round, and reactive to light.  Cardiovascular:     Rate and Rhythm: Normal rate and regular rhythm.  No extrasystoles are present.    Chest Wall: PMI is not displaced.      Pulses:          Radial pulses are 2+ on the right side and 2+ on the left side.       Dorsalis pedis pulses are 2+ on the right side and 2+ on the left side.     Heart sounds: Normal heart sounds. Heart sounds not distant. No murmur. No systolic murmur. No diastolic murmur.  Pulmonary:     Effort: Pulmonary effort is normal. No tachypnea, accessory muscle usage or respiratory distress.     Breath sounds: Normal breath sounds. No stridor. No decreased breath sounds, wheezing, rhonchi or rales.  Abdominal:     Palpations: Abdomen is soft.     Tenderness: There is no abdominal tenderness. There is no guarding or rebound.     Comments: Very mild tenderness noted in the right inguinal region with deep palpation.  No overlying erythema or edema noted.  No signs of herniation at rest or with Valsalva.  Unable to elicit worsening pain with movement of the right lower extremity.  Musculoskeletal:        General: Normal range of motion.     Cervical back: Normal range of motion and neck supple.     Right lower leg: No tenderness. No edema.     Left lower leg: No tenderness. No edema.  Skin:    General: Skin is warm and dry.     Capillary Refill: Capillary refill takes less than 2 seconds.  Neurological:     General: No focal deficit present.     Mental Status: He is alert and oriented to person, place, and time.  Psychiatric:        Mood and Affect: Mood normal.        Behavior: Behavior normal.    ED Results / Procedures / Treatments   Labs (all labs ordered are listed, but only abnormal results are displayed) Labs Reviewed  BASIC METABOLIC PANEL - Abnormal; Notable for the following components:      Result Value   Calcium 8.8 (*)    All other components within normal limits  CBC  TROPONIN I (HIGH SENSITIVITY)  TROPONIN I (HIGH SENSITIVITY)    EKG EKG Interpretation  Date/Time:  Monday August 27 2019 09:01:47 EDT Ventricular Rate:  91 PR Interval:  178 QRS Duration: 76 QT  Interval:  340 QTC Calculation: 418 R Axis:   18 Text Interpretation: Sinus rhythm with occasional Premature ventricular complexes Possible Anterior infarct , age undetermined Abnormal ECG No significant change since last tracing Confirmed by Deno Etienne [phone]) on 08/27/2019 11:10:30 AM   Radiology DG Chest 2 View  Result Date: 08/27/2019 CLINICAL  DATA:  Chest pain. EXAM: CHEST - 2 VIEW COMPARISON:  03/12/2019 FINDINGS: The cardiomediastinal silhouette is within normal limits. There is mild anterior eventration of the diaphragm bilaterally. The lungs are well inflated and clear. There is no evidence of pleural effusion or pneumothorax. No acute osseous abnormality is identified. IMPRESSION: No active cardiopulmonary disease. Electronically Signed   By: Victoriana Aziz Bores M.D.   On: 08/27/2019 09:41    Procedures Procedures   Medications Ordered in ED Medications  sodium chloride flush (NS) 0.9 % injection 3 mL (3 mLs Intravenous Not Given 08/27/19 1139)    ED Course  I have reviewed the triage vital signs and the nursing notes.  Pertinent labs & imaging results that were available during my care of the patient were reviewed by me and considered in my medical decision making (see chart for details).    MDM Rules/Calculators/A&P                       Patient is a pleasant 54 year old male with history of hypertension and anxiety who presents with what appears to be an episode of worsening anxiety this morning after experiencing high blood pressure.  He was initially experiencing chest tightness, tingling in his left upper extremity, lightheadedness, tinnitus in his left ear.  All of his symptoms have mostly alleviated since arriving to the emergency department.  His blood pressure was initially 164/87 and has improved to 147/96.  While I was in the room discussing this with him his blood pressure was 142/72.  He took lorazepam when his symptoms began and notes this provided some relief. His ECG  and chest x-ray are reassuring.  His troponins are negative x2.  His remaining labs are all benign.  He does have a history of testicular cancer and was complaining of intermittent right inguinal pain for the last 2 weeks.  He has no signs and symptoms of herniation in the region.  No penile or testicular complaints.  I discussed having him follow-up with his oncologist which he seemed amenable to. This patient was discussed with an evaluated by my attending physician Dr. Deno Etienne who agrees with the above plan.  Patient is to return to the emergency department any new or worsening symptoms.  His questions were answered and he verbalized understanding of the above plan.  He was amicable at the time of discharge.  Vital signs stable.   Final Clinical Impression(s) / ED Diagnoses Final diagnoses:  Hypertension, unspecified type  Anxiety  Inguinal pain, right    Rx / DC Orders ED Discharge Orders    None       Rayna Sexton, PA-C 08/27/19 [place], DO 08/27/19 1410

## 2019-08-27 NOTE — ED Triage Notes (Signed)
Pt reports waking up this am "feeling funny" with chest pain, dizziness, ringing in his left ear and numbness/tingling to left arm. Checked bp and it was elevated pta, normally controlled with his bp meds. Also reports right groin discomfort for past week.

## 2019-08-27 NOTE — ED Notes (Signed)
Pt  Verbalized understanding of discharge instructions. Follow up care and hypertension management reviewed, pt had no further questions. Ambulated independently to lobby.

## 2019-08-27 NOTE — Discharge Instructions (Signed)
Per our discussion, your initial labs and physical exam were all reassuring.  Please be sure to follow-up with your oncologist due to your right inguinal pain.  You can discuss with them further work-up and appropriate imaging.  Please also follow-up with your primary care provider regarding this visit as well as your blood pressure and anxiety.  It was a pleasure to meet you and please do not hesitate to return to the emergency department any new or worsening symptoms.

## 2019-08-27 NOTE — ED Triage Notes (Signed)
BP 174/97 HR 92  O2 98%  Patient reports to urgent care with elevated BP. Patient unsure if he took his BP medications yesterday. Reports he took it this morning. C/o mild numbness in his left arm and ringing in his left ear. Spoke with Loura Halt, NP, who agrees patient requires higher level of care. Patient referred to the ED. Patient agreeable to plan of care.

## 2019-08-28 ENCOUNTER — Other Ambulatory Visit (HOSPITAL_COMMUNITY): Payer: Self-pay | Admitting: Family Medicine

## 2019-08-28 DIAGNOSIS — J309 Allergic rhinitis, unspecified: Secondary | ICD-10-CM | POA: Diagnosis not present

## 2019-08-28 DIAGNOSIS — I10 Essential (primary) hypertension: Secondary | ICD-10-CM | POA: Diagnosis not present

## 2019-08-28 MED FILL — FLUTICASONE PROP 50 MCG SPR: 50 | 30 days supply | Qty: 16 | Fill #0

## 2019-09-07 DIAGNOSIS — Z125 Encounter for screening for malignant neoplasm of prostate: Secondary | ICD-10-CM | POA: Diagnosis not present

## 2019-09-07 DIAGNOSIS — E782 Mixed hyperlipidemia: Secondary | ICD-10-CM | POA: Diagnosis not present

## 2019-09-07 DIAGNOSIS — R1031 Right lower quadrant pain: Secondary | ICD-10-CM | POA: Diagnosis not present

## 2019-09-07 DIAGNOSIS — I10 Essential (primary) hypertension: Secondary | ICD-10-CM | POA: Diagnosis not present

## 2019-09-07 DIAGNOSIS — F3341 Major depressive disorder, recurrent, in partial remission: Secondary | ICD-10-CM | POA: Diagnosis not present

## 2019-09-07 DIAGNOSIS — E291 Testicular hypofunction: Secondary | ICD-10-CM | POA: Diagnosis not present

## 2019-09-07 DIAGNOSIS — F410 Panic disorder [episodic paroxysmal anxiety] without agoraphobia: Secondary | ICD-10-CM | POA: Diagnosis not present

## 2019-09-07 DIAGNOSIS — Z79899 Other long term (current) drug therapy: Secondary | ICD-10-CM | POA: Diagnosis not present

## 2019-09-07 DIAGNOSIS — Z8547 Personal history of malignant neoplasm of testis: Secondary | ICD-10-CM | POA: Diagnosis not present

## 2019-09-07 MED FILL — AMLODIPINE-BENAZEPRIL 5-20: 5-20 | 30 days supply | Qty: 30 | Fill #0

## 2019-09-12 ENCOUNTER — Other Ambulatory Visit (HOSPITAL_BASED_OUTPATIENT_CLINIC_OR_DEPARTMENT_OTHER): Payer: Self-pay | Admitting: Family Medicine

## 2019-09-12 DIAGNOSIS — R1031 Right lower quadrant pain: Secondary | ICD-10-CM

## 2019-09-18 ENCOUNTER — Other Ambulatory Visit: Payer: Self-pay

## 2019-09-18 ENCOUNTER — Encounter (HOSPITAL_BASED_OUTPATIENT_CLINIC_OR_DEPARTMENT_OTHER): Payer: Self-pay

## 2019-09-18 ENCOUNTER — Ambulatory Visit (HOSPITAL_BASED_OUTPATIENT_CLINIC_OR_DEPARTMENT_OTHER)
Admission: RE | Admit: 2019-09-18 | Discharge: 2019-09-18 | Disposition: A | Discharge status: Home / Self Care | Payer: 59 | Source: Ambulatory Visit | Attending: Family Medicine | Admitting: Family Medicine

## 2019-09-18 DIAGNOSIS — R109 Unspecified abdominal pain: Secondary | ICD-10-CM | POA: Diagnosis not present

## 2019-09-18 DIAGNOSIS — R1031 Right lower quadrant pain: Secondary | ICD-10-CM | POA: Diagnosis not present

## 2019-09-18 MED ORDER — IOHEXOL 300 MG/ML  SOLN
100.0000 mL | Freq: Once | INTRAMUSCULAR | Status: AC | PRN
  Administered 2019-09-18: 100 mL via INTRAVENOUS

## 2019-09-20 ENCOUNTER — Other Ambulatory Visit (HOSPITAL_COMMUNITY): Payer: Self-pay | Admitting: Family Medicine

## 2019-09-20 DIAGNOSIS — Z0000 Encounter for general adult medical examination without abnormal findings: Secondary | ICD-10-CM | POA: Diagnosis not present

## 2019-09-20 DIAGNOSIS — R972 Elevated prostate specific antigen [PSA]: Secondary | ICD-10-CM | POA: Diagnosis not present

## 2019-09-20 DIAGNOSIS — E291 Testicular hypofunction: Secondary | ICD-10-CM | POA: Diagnosis not present

## 2019-09-20 DIAGNOSIS — F410 Panic disorder [episodic paroxysmal anxiety] without agoraphobia: Secondary | ICD-10-CM | POA: Diagnosis not present

## 2019-09-20 DIAGNOSIS — K219 Gastro-esophageal reflux disease without esophagitis: Secondary | ICD-10-CM | POA: Diagnosis not present

## 2019-09-20 DIAGNOSIS — K760 Fatty (change of) liver, not elsewhere classified: Secondary | ICD-10-CM | POA: Diagnosis not present

## 2019-09-20 DIAGNOSIS — F411 Generalized anxiety disorder: Secondary | ICD-10-CM | POA: Diagnosis not present

## 2019-09-20 MED FILL — PANTOPRAZOLE SOD DR 40 MG T: 40 | 90 days supply | Qty: 90 | Fill #0

## 2019-09-20 MED FILL — LORAZEPAM 1 MG TABS: 1 | 17 days supply | Qty: 35 | Fill #0

## 2019-09-20 MED FILL — DESVENLAFAXINE SUC ER 50 MG: 50 | 90 days supply | Qty: 90 | Fill #0

## 2019-10-11 MED FILL — AMLODIPINE-BENAZEPRIL 5-20: 5-20 | 30 days supply | Qty: 30 | Fill #1

## 2019-10-24 ENCOUNTER — Other Ambulatory Visit (HOSPITAL_COMMUNITY): Payer: Self-pay | Admitting: Student

## 2019-10-24 ENCOUNTER — Other Ambulatory Visit: Payer: Self-pay | Admitting: Student

## 2019-10-24 DIAGNOSIS — L02215 Cutaneous abscess of perineum: Secondary | ICD-10-CM | POA: Diagnosis not present

## 2019-10-24 DIAGNOSIS — L03317 Cellulitis of buttock: Secondary | ICD-10-CM

## 2019-10-24 NOTE — Progress Notes (Signed)
Norlene Duel Appointment: 10/24/2019 3:15 PM Location: [facility] Patient #: [id number] DOB: [birth year] Married / Language: Undefined / Race: Refused to Report/Unreported Male   History of Present Illness The patient is a 54 year old male who presents with cellulitis. Patient is presenting to urgent office per request of his PCP, Dr. Harrington Challenger, who evaluated him earlier this morning, 10/24/19, for a perianal abscess. He states last week he was trying to incorporate more fiber into his diet, but he became constipated. About 6 days ago, he felt a lump in his perianal region and thought it was a hemorrhoid so he applied some over-the-counter cream. The following day he flew to [place] and felt heaviness in his rectal area. He experienced a large amount of foul-smelling, purulent drainage when sitting on the commode. Afterwards, he had some relief of his pain but the pain returned the following day. He started taking doxycycline yesterday. He states he is still having some urinary symptoms today, including difficulty starting urinary stream. Denies fevers, nausea, and vomiting. Has not had a bowel movement recently. Is not on blood thinners. Does not have diabetes and does not smoke. He has a history of testicular cancer treated with radiation in his late 20's, with recurrence about 10 years ago. He finished chemotherapy about 8 years ago.   Diagnostic Studies History  Colonoscopy  5-10 years ago  Allergies  Penicillins  Allergies Reconciled   Medication History Testosterone Cypionate (200MG /ML Solution, Intramuscular) Active. LORazepam (1MG  Tablet, Oral) Active. amLODIPine Besylate (5MG  Tablet, Oral) Active. Doxycycline Hyclate (100MG  Capsule, Oral) Active. Pantoprazole Sodium (40MG  Tablet DR, Oral) Active. Medications Reconciled  Social History  Alcohol use  Occasional alcohol use. Caffeine use  Coffee. No drug use  Tobacco use  Never smoker.  Family History   Alcohol Abuse  Father. Colon Cancer  Mother. Heart Disease  Mother.  Other Problems Anxiety Disorder  Cancer  Gastroesophageal Reflux Disease   Review of Systems General Present- Fatigue and Night Sweats. Not Present- Appetite Loss, Chills, Fever, Weight Gain and Weight Loss. Skin Not Present- Change in Wart/Mole, Dryness, Hives, Jaundice, New Lesions, Non-Healing Wounds, Rash and Ulcer. HEENT Present- Wears glasses/contact lenses. Not Present- Earache, Hearing Loss, Hoarseness, Nose Bleed, Oral Ulcers, Ringing in the Ears, Seasonal Allergies, Sinus Pain, Sore Throat, Visual Disturbances and Yellow Eyes. Respiratory Not Present- Bloody sputum, Chronic Cough, Difficulty Breathing, Snoring and Wheezing. Breast Not Present- Breast Mass, Breast Pain, Nipple Discharge and Skin Changes. Cardiovascular Not Present- Chest Pain, Difficulty Breathing Lying Down, Leg Cramps, Palpitations, Rapid Heart Rate, Shortness of Breath and Swelling of Extremities. Gastrointestinal Present- Change in Bowel Habits, Constipation and Rectal Pain. Not Present- Abdominal Pain, Bloating, Bloody Stool, Chronic diarrhea, Difficulty Swallowing, Excessive gas, Gets full quickly at meals, Hemorrhoids, Indigestion, Nausea and Vomiting. Male Genitourinary Present- Change in Urinary Stream. Not Present- Blood in Urine, Frequency, Impotence, Nocturia, Painful Urination, Urgency and Urine Leakage. Musculoskeletal Not Present- Back Pain, Joint Pain, Joint Stiffness, Muscle Pain, Muscle Weakness and Swelling of Extremities. Neurological Not Present- Decreased Memory, Fainting, Headaches, Numbness, Seizures, Tingling, Tremor, Trouble walking and Weakness. Psychiatric Present- Anxiety. Not Present- Bipolar, Change in Sleep Pattern, Depression, Fearful and Frequent crying. Endocrine Not Present- Cold Intolerance, Excessive Hunger, Hair Changes, Heat Intolerance, Hot flashes and New Diabetes. Hematology Not Present- Blood  Thinners, Easy Bruising, Excessive bleeding, Gland problems, HIV and Persistent Infections.  Vitals  10/24/2019 3:29 PM Weight: 221 lb Height: 73in Body Surface Area: 2.25 m Body Mass Index: 29.16 kg/m  Temp.:  97.50F  Pulse: 108 (Regular)  BP: 144/84(Sitting, Left Arm, Standard)  Physical Exam GENERAL: Well-developed, well nourished male in no acute distress Wearing mask  RESPIRATORY: Normal effort, no use of accessory muscles  MUSCULOSKELETAL: Normal gait Grossly normal ROM upper extremities Grossly normal ROM lower extremities  SKIN: Warm and dry Not diaphoretic  PSYCHIATRIC: Normal judgement and insight Normal mood and affect Alert, oriented x 3  Rectal There is cellulitis on both buttocks, about 3-4 cm spanning from the anal verge, tracking the entire length of the intergluteal cleft toward the perineum There are no obvious areas of fluctuance or swelling. No active drainage The area of cellulitis is mildly tender to palpation   Assessment & Plan  CELLULITIS OF BUTTOCK (L03.317) He is presenting with bilateral buttock cellulitis and spontaneous drainage of an abscess. On exam, there were no obvious signs of undrained fluid collection, but given that he still has a fair amount of cellulitis along with pain, I recommended obtaining a CT scan to better evaluate the area. I also recommended switching his antibiotics to Cipro/Flagyl which he will start tonight. We will call him with the CT scan results and plan we will forward tomorrow. If he develops worsening pain or fevers, he will go to the ER.  **This patient encounter took 20 minutes today to perform the following: take history, perform exam, review outside records, interpret imaging, counsel the patient on their diagnosis and document encounter, findings & plan in the EHR.  Carlena Hurl, PA-C

## 2019-10-25 ENCOUNTER — Encounter (HOSPITAL_COMMUNITY): Payer: Self-pay

## 2019-10-25 ENCOUNTER — Inpatient Hospital Stay (HOSPITAL_COMMUNITY)
Admission: EM | Admit: 2019-10-25 | Discharge: 2019-10-27 | DRG: 331 | Disposition: A | Discharge status: Home / Self Care | Payer: 59 | Source: Ambulatory Visit | Attending: General Surgery | Admitting: General Surgery

## 2019-10-25 ENCOUNTER — Other Ambulatory Visit: Payer: Self-pay

## 2019-10-25 ENCOUNTER — Ambulatory Visit (HOSPITAL_COMMUNITY)
Admission: RE | Admit: 2019-10-25 | Discharge: 2019-10-25 | Disposition: A | Discharge status: Home / Self Care | Payer: 59 | Source: Ambulatory Visit | Attending: Student | Admitting: Student

## 2019-10-25 DIAGNOSIS — Z923 Personal history of irradiation: Secondary | ICD-10-CM | POA: Diagnosis not present

## 2019-10-25 DIAGNOSIS — I10 Essential (primary) hypertension: Secondary | ICD-10-CM | POA: Diagnosis present

## 2019-10-25 DIAGNOSIS — F410 Panic disorder [episodic paroxysmal anxiety] without agoraphobia: Secondary | ICD-10-CM | POA: Diagnosis present

## 2019-10-25 DIAGNOSIS — K611 Rectal abscess: Secondary | ICD-10-CM | POA: Diagnosis not present

## 2019-10-25 DIAGNOSIS — Z9221 Personal history of antineoplastic chemotherapy: Secondary | ICD-10-CM

## 2019-10-25 DIAGNOSIS — K219 Gastro-esophageal reflux disease without esophagitis: Secondary | ICD-10-CM | POA: Diagnosis present

## 2019-10-25 DIAGNOSIS — Z20822 Contact with and (suspected) exposure to covid-19: Secondary | ICD-10-CM | POA: Diagnosis not present

## 2019-10-25 DIAGNOSIS — Z8547 Personal history of malignant neoplasm of testis: Secondary | ICD-10-CM | POA: Diagnosis not present

## 2019-10-25 DIAGNOSIS — L03317 Cellulitis of buttock: Secondary | ICD-10-CM | POA: Diagnosis not present

## 2019-10-25 DIAGNOSIS — F17200 Nicotine dependence, unspecified, uncomplicated: Secondary | ICD-10-CM | POA: Diagnosis present

## 2019-10-25 DIAGNOSIS — K5900 Constipation, unspecified: Secondary | ICD-10-CM | POA: Diagnosis not present

## 2019-10-25 DIAGNOSIS — K760 Fatty (change of) liver, not elsewhere classified: Secondary | ICD-10-CM | POA: Diagnosis not present

## 2019-10-25 LAB — POCT I-STAT CREATININE: Creatinine, Ser: 0.9 mg/dL (ref 0.61–1.24)

## 2019-10-25 LAB — SURGICAL PCR SCREEN
MRSA, PCR: NEGATIVE
Staphylococcus aureus: NEGATIVE

## 2019-10-25 LAB — HIV ANTIBODY (ROUTINE TESTING W REFLEX): HIV Screen 4th Generation wRfx: NONREACTIVE

## 2019-10-25 LAB — SARS CORONAVIRUS 2 BY RT PCR (HOSPITAL ORDER, PERFORMED IN ~~LOC~~ HOSPITAL LAB): SARS Coronavirus 2: NEGATIVE

## 2019-10-25 MED ORDER — SODIUM CHLORIDE 0.9 % IV SOLN: INTRAVENOUS | Status: DC

## 2019-10-25 MED ORDER — ONDANSETRON 4 MG PO TBDP: 4.0000 mg | ORAL_TABLET | Freq: Four times a day (QID) | ORAL | Status: DC | PRN

## 2019-10-25 MED ORDER — MORPHINE SULFATE (PF) 2 MG/ML IV SOLN: 2.0000 mg | INTRAVENOUS | Status: DC | PRN

## 2019-10-25 MED ORDER — SODIUM CHLORIDE (PF) 0.9 % IJ SOLN
INTRAMUSCULAR | Status: AC
  Filled 2019-10-25: qty 50

## 2019-10-25 MED ORDER — DIPHENHYDRAMINE HCL 25 MG PO CAPS: 25.0000 mg | ORAL_CAPSULE | Freq: Four times a day (QID) | ORAL | Status: DC | PRN

## 2019-10-25 MED ORDER — OXYCODONE HCL 5 MG PO TABS
5.0000 mg | ORAL_TABLET | ORAL | Status: DC | PRN
  Administered 2019-10-25: 5 mg via ORAL
  Administered 2019-10-25 (×2): 10 mg via ORAL
  Filled 2019-10-25: qty 1
  Filled 2019-10-25 (×2): qty 2

## 2019-10-25 MED ORDER — ONDANSETRON HCL 4 MG/2ML IJ SOLN
4.0000 mg | Freq: Four times a day (QID) | INTRAMUSCULAR | Status: DC | PRN
  Administered 2019-10-26: 4 mg via INTRAVENOUS
  Filled 2019-10-25: qty 2

## 2019-10-25 MED ORDER — METHOCARBAMOL 500 MG PO TABS
500.0000 mg | ORAL_TABLET | Freq: Four times a day (QID) | ORAL | Status: DC | PRN
  Administered 2019-10-25: 500 mg via ORAL
  Filled 2019-10-25: qty 1

## 2019-10-25 MED ORDER — DIPHENHYDRAMINE HCL 50 MG/ML IJ SOLN: 25.0000 mg | Freq: Four times a day (QID) | INTRAMUSCULAR | Status: DC | PRN

## 2019-10-25 MED ORDER — IOHEXOL 300 MG/ML  SOLN
100.0000 mL | Freq: Once | INTRAMUSCULAR | Status: AC | PRN
  Administered 2019-10-25: 100 mL via INTRAVENOUS

## 2019-10-25 MED ORDER — BUPROPION HCL ER (XL) 150 MG PO TB24
300.0000 mg | ORAL_TABLET | Freq: Every day | ORAL | Status: DC
  Filled 2019-10-25: qty 2

## 2019-10-25 MED ORDER — METOPROLOL TARTRATE 5 MG/5ML IV SOLN: 5.0000 mg | Freq: Four times a day (QID) | INTRAVENOUS | Status: DC | PRN

## 2019-10-25 MED ORDER — SIMETHICONE 80 MG PO CHEW: 40.0000 mg | CHEWABLE_TABLET | Freq: Four times a day (QID) | ORAL | Status: DC | PRN

## 2019-10-25 MED ORDER — SODIUM CHLORIDE 0.9% FLUSH
10.0000 mL | Freq: Two times a day (BID) | INTRAVENOUS | Status: DC
  Administered 2019-10-26: 10 mL

## 2019-10-25 MED ORDER — ACETAMINOPHEN 650 MG RE SUPP: 650.0000 mg | Freq: Four times a day (QID) | RECTAL | Status: DC | PRN

## 2019-10-25 MED ORDER — ACETAMINOPHEN 325 MG PO TABS
650.0000 mg | ORAL_TABLET | Freq: Four times a day (QID) | ORAL | Status: DC | PRN
  Administered 2019-10-25 – 2019-10-26 (×3): 650 mg via ORAL
  Filled 2019-10-25 (×3): qty 2

## 2019-10-25 MED ORDER — WITCH HAZEL-GLYCERIN EX PADS
MEDICATED_PAD | CUTANEOUS | Status: DC | PRN
  Administered 2019-10-25: 1 via TOPICAL
  Filled 2019-10-25: qty 100

## 2019-10-25 MED ORDER — POLYETHYLENE GLYCOL 3350 17 G PO PACK
17.0000 g | PACK | Freq: Every day | ORAL | Status: DC | PRN
  Administered 2019-10-25 – 2019-10-26 (×2): 17 g via ORAL
  Filled 2019-10-25 (×2): qty 1

## 2019-10-25 MED ORDER — DOCUSATE SODIUM 100 MG PO CAPS
100.0000 mg | ORAL_CAPSULE | Freq: Two times a day (BID) | ORAL | Status: DC
  Administered 2019-10-25 – 2019-10-27 (×5): 100 mg via ORAL
  Filled 2019-10-25 (×4): qty 1

## 2019-10-25 MED ORDER — CIPROFLOXACIN IN D5W 400 MG/200ML IV SOLN
400.0000 mg | Freq: Two times a day (BID) | INTRAVENOUS | Status: DC
  Administered 2019-10-25 – 2019-10-26 (×4): 400 mg via INTRAVENOUS
  Filled 2019-10-25 (×5): qty 200

## 2019-10-25 MED ORDER — ENOXAPARIN SODIUM 40 MG/0.4ML ~~LOC~~ SOLN
40.0000 mg | SUBCUTANEOUS | Status: DC
  Administered 2019-10-25 – 2019-10-26 (×2): 40 mg via SUBCUTANEOUS
  Filled 2019-10-25 (×2): qty 0.4

## 2019-10-25 MED ORDER — SODIUM CHLORIDE 0.9% FLUSH: 10.0000 mL | INTRAVENOUS | Status: DC | PRN

## 2019-10-25 MED ORDER — ENOXAPARIN SODIUM 40 MG/0.4ML ~~LOC~~ SOLN: 40.0000 mg | SUBCUTANEOUS | Status: DC

## 2019-10-25 MED ORDER — METRONIDAZOLE IN NACL 5-0.79 MG/ML-% IV SOLN
500.0000 mg | Freq: Three times a day (TID) | INTRAVENOUS | Status: DC
  Administered 2019-10-25 – 2019-10-27 (×6): 500 mg via INTRAVENOUS
  Filled 2019-10-25 (×6): qty 100

## 2019-10-25 NOTE — H&P (Signed)
RYIN AMBROSIUS May 12, 1966  [id number].    Requesting MD: Dr. Harrington Challenger (PCP) Chief Complaint/Reason for Consult: Perirectal abscess   HPI: Jeff [name] is a 54 y.o. male with a remote history of testicular cancer s/p radiation and chemotherapy who was direct admitted from the office with a perirectal abscess on the left buttock measuring 5.5 x 4.1 cm with a horseshoe shaped fluid collection also noted around the rectum and anus.  Patient reports approximate 1 week ago he began feeling a lump in his perianal region and thought it was a hemorrhoid.  He applied some over-the-counter cream to the area without any relief.  He flew out of town the following day to his condo in [place] when he began experiencing fullness in his buttocks along with increased pain.  On Monday he had a large amount of foul-smelling, purulent drainage that provided some relief.  He called his PCP who prescribed him doxycycline.  He notes that as the week progressed the area of fullness increased.  He was seen by his PCP on 6/9 who referred to [facility] for cellulitis with suspected perirectal abscess.  An outpatient CT scan was performed that showed above. He reports he did have small amount of drainage that was foul smelling, and purulent after leaving the office yesterday and a minimal amount today. He presents for admission.   Patient denies history of similar symptoms in the past.  He reports social alcohol and tobacco use.  No illicit drug use.  He is a Sales executive or for cosmetic supplies.  He has received both those of his Pfizer vaccine.  He is accompanied by his husband [name].   ROS: Review of Systems  Constitutional: Negative for chills and fever.  Respiratory: Negative for cough.   Cardiovascular: Negative for chest pain.  Gastrointestinal: Positive for constipation. Negative for abdominal pain, diarrhea and vomiting.  Genitourinary: Negative for frequency.  Musculoskeletal: Negative for back pain.    Psychiatric/Behavioral: Negative for substance abuse.  All other systems reviewed and are negative.   Family History  Problem Relation Age of Onset   Heart disease Mother    Diabetes Brother    Heart disease Maternal Grandmother    Heart disease Maternal Grandfather    Breast cancer Paternal Grandmother     Past Medical History:  Diagnosis Date   Chest pain    HTN (hypertension)    Insomnia    Panic disorder    recurrent testicullar ca dx'd lt 1990; rt 2010   Testicular cancer [facility])     Past Surgical History:  Procedure Laterality Date   SURGERY SCROTAL / TESTICULAR  2011    Social History:  reports that he has been smoking. He has never used smokeless tobacco. He reports current alcohol use. He reports that he does not use drugs.  Allergies:  Allergies  Allergen Reactions   Penicillins     Childhood allergy     Medications Prior to Admission  Medication Sig Dispense Refill   buPROPion (WELLBUTRIN XL) 300 MG 24 hr tablet Take 300 mg by mouth daily.       LORazepam (ATIVAN) 1 MG tablet Take 1 tablet (1 mg total) by mouth 3 (three) times daily as needed for anxiety. 15 tablet 0   testosterone cypionate (DEPOTESTOTERONE CYPIONATE) 200 MG/ML injection Inject 200 mg into the muscle every 14 (fourteen) days.      zolpidem (AMBIEN) 10 MG tablet Take 1 tablet (10 mg total) by mouth at bedtime as needed.  Script was fax[facility] 05/24/12 30 tablet 0     Physical Exam: Blood pressure 133/81, pulse 85, temperature 98.4 F (36.9 C), temperature source Oral, resp. rate 18, SpO2 97 %. General: pleasant, WD/WN white male who is laying in bed in NAD HEENT: head is normocephalic, atraumatic.  Sclera are noninjected.  PERRL.  Ears and nose without any masses or lesions.  Mouth is pink and moist. Dentition fair Heart: regular, rate, and rhythm.  Normal s1,s2. No obvious murmurs, gallops, or rubs noted.  Palpable pedal pulses bilaterally  Lungs: CTAB, no  wheezes, rhonchi, or rales noted.  Respiratory effort nonlabored Abd: Soft, NT/ND, +BS, no masses, hernias, or organomegaly GU: There is cellulitis on both buttocks, about 3-4 cm spanning from the anal verge, tracking the entire length of the intergluteal cleft toward the perineum On the left there is an increased area of edema/fluctuance with tenderness over this area. No active drainage The area of cellulitis is mildly tender to palpation MS: no BUE/BLE edema, calves soft and nontender Skin: warm and dry with no masses, lesions, or rashes Psych: A&Ox4 with an appropriate affect Neuro: cranial nerves grossly intact, equal strength in BUE/BLE bilaterally, normal speech, though process intact   Results for orders placed or performed during the hospital encounter of 10/25/19 (from the past 48 hour(s))  I-STAT creatinine     Status: None   Collection Time: 10/25/19  8:43 AM  Result Value Ref Range   Creatinine, Ser 0.90 0.61 - 1.24 mg/dL   CT ABDOMEN PELVIS W CONTRAST  Result Date: 10/25/2019 CLINICAL DATA:  Buttock cellulitis EXAM: CT ABDOMEN AND PELVIS WITH CONTRAST TECHNIQUE: Multidetector CT imaging of the abdomen and pelvis was performed using the standard protocol following bolus administration of intravenous contrast. CONTRAST:  111mL OMNIPAQUE IOHEXOL 300 MG/ML  SOLN COMPARISON:  09/18/2019 FINDINGS: Lower chest: Lung bases are clear. No effusions. Heart is normal size. Hepatobiliary: Mild diffuse fatty infiltration. No focal hepatic abnormality. Gallbladder unremarkable. Pancreas: No focal abnormality or ductal dilatation. Spleen: No focal abnormality.  Normal size. Adrenals/Urinary Tract: No adrenal abnormality. No focal renal abnormality. No stones or hydronephrosis. Urinary bladder is unremarkable. Stomach/Bowel: Moderate stool burden throughout the colon. Normal appendix. Stomach, large and small bowel grossly unremarkable. Vascular/Lymphatic: Aortic atherosclerosis. No enlarged  abdominal or pelvic lymph nodes. Reproductive: No visible focal abnormality. Other: Fluid collection is noted in the medial left buttock soft tissues measuring 5.5 x 4.1 cm. Horseshoe shaped fluid collection also noted around the rectum and anus compatible with abscess. Musculoskeletal: No acute bony abnormality. IMPRESSION: Perianal/perirectal and left medial buttock abscesses. Moderate stool burden. Mild hepatic steatosis. Aortic atherosclerosis. Electronically Signed   By: Rolm Baptise M.D.   On: 10/25/2019 09:26   Anti-infectives (From admission, onward)   Start     Dose/Rate Route Frequency Ordered Stop   10/25/19 1330  ciprofloxacin (CIPRO) IVPB 400 mg     Discontinue     400 mg 200 mL/hr over 60 Minutes Intravenous Every 12 hours 10/25/19 1320     10/25/19 1330  metroNIDAZOLE (FLAGYL) IVPB 500 mg     Discontinue     500 mg 100 mL/hr over 60 Minutes Intravenous Every 8 hours 10/25/19 1320         Assessment/Plan HTN - No home meds on file. PRN meds  Perirectal abscess - CT with 5.5 x 4.1 cm with a horseshoe shaped fluid collection also noted around the rectum and anus - Will plan for EAU and  drainage in the OR tomorrow AM - Okay for diet today, NPO at midnight - Admit to outpatient observation   FEN - Reg, NPO at midnight VTE - SCDs, Lovenox ID - Cipro/Flagyl   Jillyn Ledger, [facility] 10/25/2019, 1:45 PM Please see Amion for pager number during day hours 7:00am-4:30pm

## 2019-10-25 NOTE — Anesthesia Preprocedure Evaluation (Addendum)
Anesthesia Evaluation  Patient identified by MRN, date of birth, ID band Patient awake    Reviewed: Allergy & Precautions, NPO status , Patient's Chart, lab work & pertinent test results  History of Anesthesia Complications Negative for: history of anesthetic complications  Airway Mallampati: II  TM Distance: >3 FB Neck ROM: Full    Dental no notable dental hx.    Pulmonary Current Smoker and Patient abstained from smoking.,    Pulmonary exam normal        Cardiovascular hypertension, Pt. on medications Normal cardiovascular exam     Neuro/Psych Anxiety negative neurological ROS     GI/Hepatic Neg liver ROS, GERD  Controlled and Medicated,  Endo/Other  negative endocrine ROS  Renal/GU negative Renal ROS  negative genitourinary   Musculoskeletal negative musculoskeletal ROS (+)   Abdominal   Peds  Hematology negative hematology ROS (+)   Anesthesia Other Findings Perirectal abscess  Reproductive/Obstetrics negative OB ROS                            Anesthesia Physical Anesthesia Plan  ASA: II  Anesthesia Plan: General   Post-op Pain Management:    Induction: Intravenous  PONV Risk Score and Plan: 2 and Treatment may vary due to age or medical condition, Ondansetron, Dexamethasone and Midazolam  Airway Management Planned: Oral ETT  Additional Equipment: None  Intra-op Plan:   Post-operative Plan: Extubation in OR  Informed Consent: I have reviewed the patients History and Physical, chart, labs and discussed the procedure including the risks, benefits and alternatives for the proposed anesthesia with the patient or authorized representative who has indicated his/her understanding and acceptance.     Dental advisory given  Plan Discussed with: CRNA  Anesthesia Plan Comments:        Anesthesia Quick Evaluation

## 2019-10-26 ENCOUNTER — Encounter (HOSPITAL_COMMUNITY): Admission: EM | Disposition: A | Discharge status: Home / Self Care | Payer: Self-pay | Source: Ambulatory Visit

## 2019-10-26 ENCOUNTER — Observation Stay (HOSPITAL_COMMUNITY): Payer: 59 | Admitting: Certified Registered"

## 2019-10-26 DIAGNOSIS — K5900 Constipation, unspecified: Secondary | ICD-10-CM | POA: Diagnosis present

## 2019-10-26 DIAGNOSIS — Z923 Personal history of irradiation: Secondary | ICD-10-CM | POA: Diagnosis not present

## 2019-10-26 DIAGNOSIS — F410 Panic disorder [episodic paroxysmal anxiety] without agoraphobia: Secondary | ICD-10-CM | POA: Diagnosis present

## 2019-10-26 DIAGNOSIS — F17200 Nicotine dependence, unspecified, uncomplicated: Secondary | ICD-10-CM | POA: Diagnosis present

## 2019-10-26 DIAGNOSIS — K219 Gastro-esophageal reflux disease without esophagitis: Secondary | ICD-10-CM | POA: Diagnosis not present

## 2019-10-26 DIAGNOSIS — Z8547 Personal history of malignant neoplasm of testis: Secondary | ICD-10-CM | POA: Diagnosis not present

## 2019-10-26 DIAGNOSIS — Z9221 Personal history of antineoplastic chemotherapy: Secondary | ICD-10-CM | POA: Diagnosis not present

## 2019-10-26 DIAGNOSIS — I10 Essential (primary) hypertension: Secondary | ICD-10-CM | POA: Diagnosis not present

## 2019-10-26 DIAGNOSIS — K611 Rectal abscess: Secondary | ICD-10-CM | POA: Diagnosis not present

## 2019-10-26 DIAGNOSIS — Z20822 Contact with and (suspected) exposure to covid-19: Secondary | ICD-10-CM | POA: Diagnosis present

## 2019-10-26 HISTORY — PX: INCISION AND DRAINAGE PERIRECTAL ABSCESS: SHX1804

## 2019-10-26 LAB — CBC
HCT: 34.6 % — ABNORMAL LOW (ref 39.0–52.0)
Hemoglobin: 11.5 g/dL — ABNORMAL LOW (ref 13.0–17.0)
MCH: 31.6 pg (ref 26.0–34.0)
MCHC: 33.2 g/dL (ref 30.0–36.0)
MCV: 95.1 fL (ref 80.0–100.0)
Platelets: 350 10*3/uL (ref 150–400)
RBC: 3.64 MIL/uL — ABNORMAL LOW (ref 4.22–5.81)
RDW: 13.1 % (ref 11.5–15.5)
WBC: 12.5 10*3/uL — ABNORMAL HIGH (ref 4.0–10.5)
nRBC: 0 % (ref 0.0–0.2)

## 2019-10-26 LAB — BASIC METABOLIC PANEL
Anion gap: 5 (ref 5–15)
BUN: 8 mg/dL (ref 6–20)
CO2: 24 mmol/L (ref 22–32)
Calcium: 8.5 mg/dL — ABNORMAL LOW (ref 8.9–10.3)
Chloride: 103 mmol/L (ref 98–111)
Creatinine, Ser: 1.01 mg/dL (ref 0.61–1.24)
GFR calc Af Amer: >60 mL/min (ref >60)
GFR calc non Af Amer: >60 mL/min (ref >60)
Glucose, Bld: 131 mg/dL — ABNORMAL HIGH (ref 70–99)
Potassium: 3.5 mmol/L (ref 3.5–5.1)
Sodium: 132 mmol/L — ABNORMAL LOW (ref 135–145)

## 2019-10-26 SURGERY — INCISION AND DRAINAGE, ABSCESS, PERIRECTAL
Anesthesia: General | Site: Perineum

## 2019-10-26 MED ORDER — OXYCODONE HCL 5 MG/5ML PO SOLN: 5.0000 mg | Freq: Once | ORAL | Status: DC | PRN

## 2019-10-26 MED ORDER — BENAZEPRIL HCL 5 MG PO TABS
20.0000 mg | ORAL_TABLET | Freq: Every day | ORAL | Status: DC
  Administered 2019-10-27: 20 mg via ORAL
  Filled 2019-10-26: qty 4

## 2019-10-26 MED ORDER — FENTANYL CITRATE (PF) 250 MCG/5ML IJ SOLN
INTRAMUSCULAR | Status: AC
  Filled 2019-10-26: qty 5

## 2019-10-26 MED ORDER — ROCURONIUM BROMIDE 10 MG/ML (PF) SYRINGE
PREFILLED_SYRINGE | INTRAVENOUS | Status: DC | PRN
Start: 2019-10-26 — End: 2019-10-26
  Administered 2019-10-26: 60 mg via INTRAVENOUS

## 2019-10-26 MED ORDER — DESVENLAFAXINE SUCCINATE ER 50 MG PO TB24
50.0000 mg | ORAL_TABLET | Freq: Every day | ORAL | Status: DC
  Administered 2019-10-27: 50 mg via ORAL
  Filled 2019-10-26: qty 1

## 2019-10-26 MED ORDER — DOXYCYCLINE HYCLATE 100 MG PO TABS: 100.0000 mg | ORAL_TABLET | Freq: Two times a day (BID) | ORAL | Status: DC

## 2019-10-26 MED ORDER — DEXAMETHASONE SODIUM PHOSPHATE 10 MG/ML IJ SOLN
INTRAMUSCULAR | Status: DC | PRN
  Administered 2019-10-26: 10 mg via INTRAVENOUS

## 2019-10-26 MED ORDER — OXYCODONE HCL 5 MG PO TABS: 5.0000 mg | ORAL_TABLET | ORAL | Status: DC | PRN

## 2019-10-26 MED ORDER — ACETAMINOPHEN 500 MG PO TABS: 1000.0000 mg | ORAL_TABLET | Freq: Once | ORAL | Status: DC

## 2019-10-26 MED ORDER — FENTANYL CITRATE (PF) 100 MCG/2ML IJ SOLN: 25.0000 ug | INTRAMUSCULAR | Status: DC | PRN

## 2019-10-26 MED ORDER — BUPIVACAINE HCL (PF) 0.25 % IJ SOLN
INTRAMUSCULAR | Status: AC
  Filled 2019-10-26: qty 30

## 2019-10-26 MED ORDER — PROMETHAZINE HCL 25 MG/ML IJ SOLN: 6.2500 mg | INTRAMUSCULAR | Status: DC | PRN

## 2019-10-26 MED ORDER — LORAZEPAM 1 MG PO TABS: 1.0000 mg | ORAL_TABLET | Freq: Three times a day (TID) | ORAL | Status: DC | PRN

## 2019-10-26 MED ORDER — FLUTICASONE PROPIONATE 50 MCG/ACT NA SUSP
1.0000 | Freq: Every day | NASAL | Status: DC
  Administered 2019-10-27: 2 via NASAL
  Filled 2019-10-26: qty 16

## 2019-10-26 MED ORDER — LIDOCAINE 2% (20 MG/ML) 5 ML SYRINGE
INTRAMUSCULAR | Status: DC | PRN
  Administered 2019-10-26: 100 mg via INTRAVENOUS

## 2019-10-26 MED ORDER — DEXAMETHASONE SODIUM PHOSPHATE 10 MG/ML IJ SOLN
INTRAMUSCULAR | Status: AC
  Filled 2019-10-26: qty 1

## 2019-10-26 MED ORDER — OXYCODONE HCL 5 MG PO TABS: 5.0000 mg | ORAL_TABLET | Freq: Once | ORAL | Status: DC | PRN

## 2019-10-26 MED ORDER — LACTATED RINGERS IV SOLN: INTRAVENOUS | Status: DC | PRN

## 2019-10-26 MED ORDER — 0.9 % SODIUM CHLORIDE (POUR BTL) OPTIME
TOPICAL | Status: DC | PRN
Start: 2019-10-26 — End: 2019-10-26
  Administered 2019-10-26: 1000 mL

## 2019-10-26 MED ORDER — LIDOCAINE 2% (20 MG/ML) 5 ML SYRINGE
INTRAMUSCULAR | Status: AC
  Filled 2019-10-26: qty 5

## 2019-10-26 MED ORDER — ONDANSETRON HCL 4 MG/2ML IJ SOLN
INTRAMUSCULAR | Status: AC
  Filled 2019-10-26: qty 2

## 2019-10-26 MED ORDER — BUPIVACAINE HCL (PF) 0.25 % IJ SOLN
INTRAMUSCULAR | Status: DC | PRN
  Administered 2019-10-26: 20 mL

## 2019-10-26 MED ORDER — PANTOPRAZOLE SODIUM 40 MG PO TBEC
40.0000 mg | DELAYED_RELEASE_TABLET | Freq: Every day | ORAL | Status: DC
  Administered 2019-10-26 – 2019-10-27 (×2): 40 mg via ORAL
  Filled 2019-10-26 (×2): qty 1

## 2019-10-26 MED ORDER — AMLODIPINE BESYLATE 5 MG PO TABS
5.0000 mg | ORAL_TABLET | Freq: Every day | ORAL | Status: DC
  Administered 2019-10-27: 5 mg via ORAL
  Filled 2019-10-26: qty 1

## 2019-10-26 MED ORDER — FENTANYL CITRATE (PF) 100 MCG/2ML IJ SOLN
INTRAMUSCULAR | Status: DC | PRN
  Administered 2019-10-26: 50 ug via INTRAVENOUS
  Administered 2019-10-26: 100 ug via INTRAVENOUS

## 2019-10-26 MED ORDER — HYDROMORPHONE HCL 1 MG/ML IJ SOLN: 1.0000 mg | INTRAMUSCULAR | Status: DC | PRN

## 2019-10-26 MED ORDER — MIDAZOLAM HCL 5 MG/5ML IJ SOLN
INTRAMUSCULAR | Status: DC | PRN
  Administered 2019-10-26: 2 mg via INTRAVENOUS

## 2019-10-26 MED ORDER — ROCURONIUM BROMIDE 10 MG/ML (PF) SYRINGE
PREFILLED_SYRINGE | INTRAVENOUS | Status: AC
  Filled 2019-10-26: qty 10

## 2019-10-26 MED ORDER — PROPOFOL 10 MG/ML IV BOLUS
INTRAVENOUS | Status: AC
  Filled 2019-10-26: qty 40

## 2019-10-26 MED ORDER — SUGAMMADEX SODIUM 200 MG/2ML IV SOLN
INTRAVENOUS | Status: DC | PRN
Start: 2019-10-26 — End: 2019-10-26
  Administered 2019-10-26: 200 mg via INTRAVENOUS

## 2019-10-26 MED ORDER — AMLODIPINE BESY-BENAZEPRIL HCL 5-20 MG PO CAPS: 1.0000 | ORAL_CAPSULE | Freq: Every day | ORAL | Status: DC

## 2019-10-26 MED ORDER — MIDAZOLAM HCL 2 MG/2ML IJ SOLN
INTRAMUSCULAR | Status: AC
  Filled 2019-10-26: qty 2

## 2019-10-26 MED ORDER — ONDANSETRON HCL 4 MG/2ML IJ SOLN
INTRAMUSCULAR | Status: DC | PRN
Start: 2019-10-26 — End: 2019-10-26
  Administered 2019-10-26: 4 mg via INTRAVENOUS

## 2019-10-26 MED ORDER — PROPOFOL 10 MG/ML IV BOLUS
INTRAVENOUS | Status: DC | PRN
  Administered 2019-10-26: 200 mg via INTRAVENOUS

## 2019-10-26 SURGICAL SUPPLY — 31 items
CANISTER SUCT 3000ML PPV (MISCELLANEOUS) ×2 IMPLANT
COVER SURGICAL LIGHT HANDLE (MISCELLANEOUS) ×2 IMPLANT
COVER WAND RF STERILE (DRAPES) ×2 IMPLANT
DRAIN PENROSE 1/2X12 LTX STRL (WOUND CARE) ×2 IMPLANT
DRAPE UTILITY XL STRL (DRAPES) ×2 IMPLANT
DRSG PAD ABDOMINAL 8X10 ST (GAUZE/BANDAGES/DRESSINGS) ×2 IMPLANT
ELECT REM PT RETURN 9FT ADLT (ELECTROSURGICAL) ×2
ELECTRODE REM PT RTRN 9FT ADLT (ELECTROSURGICAL) ×1 IMPLANT
GAUZE SPONGE 4X4 12PLY STRL (GAUZE/BANDAGES/DRESSINGS) ×2 IMPLANT
GLOVE BIO SURGEON STRL SZ7.5 (GLOVE) ×2 IMPLANT
GLOVE BIOGEL PI IND STRL 8 (GLOVE) ×1 IMPLANT
GLOVE BIOGEL PI INDICATOR 8 (GLOVE) ×1
GOWN STRL REUS W/ TWL LRG LVL3 (GOWN DISPOSABLE) ×1 IMPLANT
GOWN STRL REUS W/ TWL XL LVL3 (GOWN DISPOSABLE) ×1 IMPLANT
GOWN STRL REUS W/TWL LRG LVL3 (GOWN DISPOSABLE) ×2
GOWN STRL REUS W/TWL XL LVL3 (GOWN DISPOSABLE) ×2
KIT BASIN OR (CUSTOM PROCEDURE TRAY) ×2 IMPLANT
KIT TURNOVER KIT B (KITS) ×2 IMPLANT
NS IRRIG 1000ML POUR BTL (IV SOLUTION) ×2 IMPLANT
PACK LITHOTOMY IV (CUSTOM PROCEDURE TRAY) ×2 IMPLANT
PAD ARMBOARD 7.5X6 YLW CONV (MISCELLANEOUS) ×2 IMPLANT
PENCIL SMOKE EVACUATOR (MISCELLANEOUS) ×2 IMPLANT
SPONGE LAP 18X18 RF (DISPOSABLE) ×2 IMPLANT
SUT ETHILON 2 0 FS 18 (SUTURE) ×2 IMPLANT
SWAB COLLECTION DEVICE MRSA (MISCELLANEOUS) IMPLANT
SWAB CULTURE ESWAB REG 1ML (MISCELLANEOUS) IMPLANT
TOWEL GREEN STERILE (TOWEL DISPOSABLE) ×2 IMPLANT
TOWEL GREEN STERILE FF (TOWEL DISPOSABLE) ×2 IMPLANT
TUBE CONNECTING 12X1/4 (SUCTIONS) ×2 IMPLANT
UNDERPAD 30X36 HEAVY ABSORB (UNDERPADS AND DIAPERS) ×2 IMPLANT
YANKAUER SUCT BULB TIP NO VENT (SUCTIONS) ×2 IMPLANT

## 2019-10-26 NOTE — Anesthesia Postprocedure Evaluation (Signed)
Anesthesia Post Note  Patient: Jeff [name]  Procedure(s) Performed: IRRIGATION AND DEBRIDEMENT PERIRECTAL ABSCESS (N/A Perineum)     Patient location during evaluation: PACU Anesthesia Type: General Level of consciousness: awake and alert and oriented Pain management: pain level controlled Vital Signs Assessment: post-procedure vital signs reviewed and stable Respiratory status: spontaneous breathing, nonlabored ventilation and respiratory function stable Cardiovascular status: blood pressure returned to baseline Postop Assessment: no apparent nausea or vomiting Anesthetic complications: no   No complications documented.  Last Vitals:  Vitals:   10/26/19 0850 10/26/19 0903  BP: 114/73 93/76  Pulse: 71 66  Resp: 20 19  Temp: 36.9 C 37.1 C  SpO2: 95% 98%    Last Pain:  Vitals:   10/26/19 0903  TempSrc: Oral  PainSc:                  Brennan Bailey

## 2019-10-26 NOTE — Op Note (Signed)
10/26/2019  8:22 AM  PATIENT:  Jeff [name]  54 y.o. male  PRE-OPERATIVE DIAGNOSIS:  PERIRECTAL ABSCESS  POST-OPERATIVE DIAGNOSIS:  HORSESHOE PERIRECTAL ABSCESS  PROCEDURE:  Procedure(s): IRRIGATION AND DEBRIDEMENT PERIRECTAL ABSCESS (N/A)  SURGEON:  Surgeon(s) and Role:    * Ralene Ok, MD - Primary  ANESTHESIA:   local and general  EBL:  20 mL   BLOOD ADMINISTERED:none  DRAINS: Penrose drain in the horseshoe abscess   LOCAL MEDICATIONS USED:  BUPIVICAINE   SPECIMEN:  Source of Specimen:  Perirectal abscess  DISPOSITION OF SPECIMEN:  MICRO  COUNTS:  YES  TOURNIQUET:  * No tourniquets in log *  DICTATION: .Dragon Dictation Indication for procedure: Patient is a 12 male with a history of a perirectal abscess seen on CT scan.  This appeared to be a horseshoe abscess on CT scan.  Patient was exquisitely tender.  Patient was taken back to the operating room urgently for I&D of abscess.  Findings: Patient had a large amount of purulence expressed from the abscess cavity in the area.  Patient also did have abscess that went posterior to the rectum.  The H. Penrose drain was placed in the course of abscess cavity and it was sutured to the skin.  Details of procedure: The patient was consented he was taken back to the OR placed in the supine assistance.  He underwent general endotracheal intubation.  Patient was then placed in the prone position.  Patient prepped and draped in standard fashion.  A timeout was called.  At this time the incision was made parallel to the skin from the left side.  There is large amount of purulence expressed.  Cultures were taken of the abscess cavity.  Blunt dissection was used to dissect loculations of gluteal abscess.  There was an expression of purulence within the rectum.  A counterincision was made on the right perirectal area.  This was directed posteriorly left-sided abscess cavity.  This was sutured using a 2-0 nylon to the skin.  The area  was irrigated out with sterile saline.  The area was infiltrated with 0.25 % Marcaine the wound was dressed with 4 x 4's, ABD pad, and mesh  Patient tolerated the procedure well was taken to the recovery room     PLAN OF CARE: Admit to inpatient   PATIENT DISPOSITION:  PACU - hemodynamically stable.   Delay start of Pharmacological VTE agent (>24hrs) due to surgical blood loss or risk of bleeding: yes

## 2019-10-26 NOTE — Anesthesia Procedure Notes (Signed)
Procedure Name: Intubation Date/Time: 10/26/2019 7:39 AM Performed by: Candis Shine, CRNA Pre-anesthesia Checklist: Patient identified, Emergency Drugs available, Suction available and Patient being monitored Patient Re-evaluated:Patient Re-evaluated prior to induction Oxygen Delivery Method: Circle System Utilized Preoxygenation: Pre-oxygenation with 100% oxygen Induction Type: IV induction Ventilation: Mask ventilation without difficulty Laryngoscope Size: Glidescope and 4 Grade View: Grade I Tube type: Oral Tube size: 7.5 mm Number of attempts: 1 Airway Equipment and Method: Video-laryngoscopy and Rigid stylet Placement Confirmation: ETT inserted through vocal cords under direct vision,  positive ETCO2 and breath sounds checked- equal and bilateral Secured at: 22 cm Tube secured with: Tape Dental Injury: Teeth and Oropharynx as per pre-operative assessment  Comments: Elective glidescope intubation for teaching purposes. Intubation by Camillia Herter, EMT student with direction from Dr. Daiva Huge.

## 2019-10-26 NOTE — Transfer of Care (Signed)
Immediate Anesthesia Transfer of Care Note  Patient: Jeff [name]  Procedure(s) Performed: IRRIGATION AND DEBRIDEMENT PERIRECTAL ABSCESS (N/A Perineum)  Patient Location: PACU  Anesthesia Type:General  Level of Consciousness: awake, alert  and oriented  Airway & Oxygen Therapy: Patient Spontanous Breathing  Post-op Assessment: Report given to RN and Post -op Vital signs reviewed and stable  Post vital signs: Reviewed and stable  Last Vitals:  Vitals Value Taken Time  BP 134/80 10/26/19 0835  Temp    Pulse 80 10/26/19 0836  Resp 14 10/26/19 0836  SpO2 98 % 10/26/19 0836  Vitals shown include unvalidated device data.  Last Pain:  Vitals:   10/26/19 0549  TempSrc: Oral  PainSc:       Patients Stated Pain Goal: 2 (03/83/33 8329)  Complications: No complications documented.

## 2019-10-27 ENCOUNTER — Encounter (HOSPITAL_COMMUNITY): Payer: Self-pay | Admitting: General Surgery

## 2019-10-27 MED ORDER — OXYCODONE HCL 5 MG PO TABS: 5.0000 mg | ORAL_TABLET | Freq: Four times a day (QID) | ORAL | 0 refills | Status: DC | PRN

## 2019-10-27 MED ORDER — POLYETHYLENE GLYCOL 3350 17 G PO PACK: 17.0000 g | PACK | Freq: Every day | ORAL | 0 refills | Status: AC | PRN

## 2019-10-27 MED ORDER — DOCUSATE SODIUM 100 MG PO CAPS: 100.0000 mg | ORAL_CAPSULE | Freq: Two times a day (BID) | ORAL | 0 refills | Status: AC | PRN

## 2019-10-27 NOTE — Discharge Instructions (Signed)
Take Cipro and Flagyl antibiotics to completion. Please note that you should not drink while taking Flagyl. Note that Ciprofloxacin can increase your risk for tendon rupture. Please avoid high impact activities such as running, jumping etc   Anorectal Abscess An abscess is an infected area that contains a collection of pus. An anorectal abscess is an abscess that is near the opening of the anus or around the rectum. Without treatment, an anorectal abscess can become larger and cause other problems, such as a more serious body-wide infection or pain, especially during bowel movements. What are the causes? This condition is caused by plugged glands or an infection in one of these areas:  The anus.  The area between the anus and the scrotum in males or between the anus and the vagina in females (perineum). What increases the risk? The following factors may make you more likely to develop this condition:  Diabetes or inflammatory bowel disease.  Having a body defense system (immune system) that is weak.  Engaging in anal sex.  Having a sexually transmitted infection (STI).  Certain kinds of cancer, such as rectal carcinoma, leukemia, or lymphoma. What are the signs or symptoms? The main symptom of this condition is pain. The pain may be a throbbing pain that gets worse during bowel movements. Other symptoms include:  Swelling and redness in the area of the abscess. The redness may go beyond the abscess and appear as a red streak on the skin.  A visible, painful lump, or a lump that can be felt when touched.  Bleeding or pus-like discharge from the area.  Fever.  General weakness.  Constipation.  Diarrhea. How is this diagnosed? This condition is diagnosed based on your medical history and a physical exam of the affected area.  This may involve examining the rectal area with a gloved hand (digital rectal exam).  Sometimes, the health care provider needs to look into the rectum  using a probe, scope, or imaging test.  For women, it may require a careful vaginal exam. How is this treated? Treatment for this condition may include:  Incision and drainage surgery. This involves making an incision over the abscess to drain the pus.  Medicines, including antibiotic medicine, pain medicine, stool softeners, or laxatives. Follow these instructions at home: Medicines  Take over-the-counter and prescription medicines only as told by your health care provider.  If you were prescribed an antibiotic medicine, use it as told by your health care provider. Do not stop using the antibiotic even if you start to feel better.  Do not drive or use heavy machinery while taking prescription pain medicine. Wound care   If gauze was used in the abscess, follow instructions from your health care provider about removing or changing the gauze. It can usually be removed in 2-3 days.  Wash your hands with soap and water before you remove or change your gauze. If soap and water are not available, use hand sanitizer.  If one or more drains were placed in the abscess cavity, be careful not to pull at them. Your health care provider will tell you how long they need to remain in place.  Check your incision area every day for signs of infection. Check for: ? More redness, swelling, or pain. ? More fluid or blood. ? Warmth. ? Pus or a bad smell. Managing pain, stiffness, and swelling   Take a sitz bath 3-4 times a day and after bowel movements. This will help reduce pain and swelling.  To relieve pain, try sitting: ? On a heating pad with the setting on low. ? On an inflatable donut-shaped cushion.  If directed, put ice on the affected area: ? Put ice in a plastic bag. ? Place a towel between your skin and the bag. ? Leave the ice on for 20 minutes, 2-3 times a day. General instructions  Follow any diet instructions given by your health care provider.  Keep all follow-up visits  as told by your health care provider. This is important. Contact a health care provider if you have:  Bleeding from your incision.  Pain, swelling, or redness that does not improve or gets worse.  Trouble passing stool or urine.  Symptoms that return after treatment. Get help right away if you:  Have problems moving or using your legs.  Have severe or increasing pain.  Have swelling in the affected area that suddenly gets worse.  Have a large increase in bleeding or passing of pus.  Develop chills or a fever. Summary  An anorectal abscess is an abscess that is near the opening of the anus or around the rectum. An abscess is an infected area that contains a collection of pus.  The main symptom of this condition is pain. It may be a throbbing pain that gets worse during bowel movements.  Treatment for an anorectal abscess may include surgery to drain the pus from the abscess. Medicines and sitz baths may also be a part of your treatment plan. This information is not intended to replace advice given to you by your health care provider. Make sure you discuss any questions you have with your health care provider. Document Revised: 06/09/2017 Document Reviewed: 06/09/2017 Elsevier Patient Education  2020 [employer].  How to Take a [employer] A sitz bath is a warm water bath that may be used to care for your rectum, genital area, or the area between your rectum and genitals (perineum). For a sitz bath, the water only comes up to your hips and covers your buttocks. A sitz bath may done at home in a bathtub or with a portable sitz bath that fits over the toilet. Your health care provider may recommend a sitz bath to help:  Relieve pain and discomfort after delivering a baby.  Relieve pain and itching from hemorrhoids or anal fissures.  Relieve pain after certain surgeries.  Relax muscles that are sore or tight. How to take a sitz bath Take 3-4 sitz baths a day, or as many as told  by your health care provider. Bathtub sitz bath To take a sitz bath in a bathtub: 1. Partially fill a bathtub with warm water. The water should be deep enough to cover your hips and buttocks when you are sitting in the tub. 2. If your health care provider told you to put medicine in the water, follow his or her instructions. 3. Sit in the water. 4. Open the tub drain a little, and leave it open during your bath. 5. Turn on the warm water again, enough to replace the water that is draining out. Keep the water running throughout your bath. This helps keep the water at the right level and the right temperature. 6. Soak in the water for 15-20 minutes, or as long as told by your health care provider. 7. When you are done, be careful when you stand up. You may feel dizzy. 8. After the sitz bath, pat yourself dry. Do not rub your skin to dry it.  Over-the-toilet sitz  bath To take a sitz bath with an over-the-toilet basin: 1. Follow the manufacturer's instructions. 2. Fill the basin with warm water. 3. If your health care provider told you to put medicine in the water, follow his or her instructions. 4. Sit on the seat. Make sure the water covers your buttocks and perineum. 5. Soak in the water for 15-20 minutes, or as long as told by your health care provider. 6. After the sitz bath, pat yourself dry. Do not rub your skin to dry it. 7. Clean and dry the basin between uses. 8. Discard the basin if it cracks, or according to the manufacturer's instructions. Contact a health care provider if:  Your symptoms get worse. Do not continue with sitz baths if your symptoms get worse.  You have new symptoms. If this happens, do not continue with sitz baths until you talk with your health care provider. Summary  A sitz bath is a warm water bath in which the water only comes up to your hips and covers your buttocks.  A sitz bath may help relieve itching, relieve pain, and relax muscles that are sore or  tight in the lower part of your body, including your genital area.  Take 3-4 sitz baths a day, or as many as told by your health care provider. Soak in the water for 15-20 minutes.  Do not continue with sitz baths if your symptoms get worse. This information is not intended to replace advice given to you by your health care provider. Make sure you discuss any questions you have with your health care provider. Document Revised: 10/02/2018 Document Reviewed: 05/05/2017 Elsevier Patient Education  [place].

## 2019-10-27 NOTE — Progress Notes (Signed)
Discharged patient to home, AVS given and explained, patient demonstrated understanding. Belongings returned accordingly.

## 2019-10-27 NOTE — Discharge Summary (Signed)
Patient ID: Jeff [name] [id number] 27-Apr-1966 54 y.o.  Admit date: 10/25/2019 Discharge date: 10/27/2019  Admitting Diagnosis: Perirectal abscess  Discharge Diagnosis HORSESHOE PERIRECTAL ABSCESS  Consultants None  Reason for Admission: Jeff [name] is a 54 y.o. male with a remote history of testicular cancer s/p radiation and chemotherapy who was direct admitted from the office with a perirectal abscess on the left buttock measuring 5.5 x 4.1 cm with a horseshoe shaped fluid collection also noted around the rectum and anus.  Patient reports approximate 1 week ago he began feeling a lump in his perianal region and thought it was a hemorrhoid.  He applied some over-the-counter cream to the area without any relief.  He flew out of town the following day to his condo in [place] when he began experiencing fullness in his buttocks along with increased pain.  On Monday he had a large amount of foul-smelling, purulent drainage that provided some relief.  He called his PCP who prescribed him doxycycline.  He notes that as the week progressed the area of fullness increased.  He was seen by his PCP on 6/9 who referred to [facility] for cellulitis with suspected perirectal abscess.  An outpatient CT scan was performed that showed above. He reports he did have small amount of drainage that was foul smelling, and purulent after leaving the office yesterday and a minimal amount today. He presents for admission.   Patient denies history of similar symptoms in the past.  He reports social alcohol and tobacco use.  No illicit drug use.  He is a Sales executive or for cosmetic supplies.  He has received both those of his Pfizer vaccine.  He is accompanied by his husband Jeff [name].  Procedures Dr. Rosendo [name] - IRRIGATION AND DEBRIDEMENT PERIRECTAL ABSCESS (N/A) - 10/26/2019  Hospital Course:  Patient was admitted to the general surgery service. Patient was taken to the OR for I&D as noted above on 6/11. Patient  tolerated the procedure well. On POD 1, the patient was voiding well, tolerating diet, ambulating well, pain well controlled, vital signs stable, wound clean with penrose in place and felt stable for discharge home. Follow up noted below. Wound care instructions discussed.  Physical Exam: Gen:  Alert, NAD, pleasant Lungs: Normal rate and effort  Abd: Soft, NT/ND, +BS GU: Patient with I&D of left and right buttock with penrose drain connecting the two. Small amount of purulent drainage b/l from incisions. Cellulitis improving. No further areas of fluctuance.  Ext:  No LE edema  Psych: A&Ox3  Skin: no rashes noted, warm and dry   Allergies as of 10/27/2019      Reactions   Penicillins    Childhood allergy       Medication List    STOP taking these medications   doxycycline 100 MG capsule Commonly known as: VIBRAMYCIN     TAKE these medications   amLODipine-benazepril 5-20 MG capsule Commonly known as: LOTREL Take 1 capsule by mouth daily.   ciprofloxacin 500 MG tablet Commonly known as: CIPRO Take 500 mg by mouth 2 (two) times daily.   desvenlafaxine 50 MG 24 hr tablet Commonly known as: PRISTIQ Take 50 mg by mouth daily.   docusate sodium 100 MG capsule Commonly known as: COLACE Take 1 capsule (100 mg total) by mouth 2 (two) times daily as needed for mild constipation.   fluticasone 50 MCG/ACT nasal spray Commonly known as: FLONASE Place 1-2 sprays into both nostrils daily.   LORazepam 1 MG tablet Commonly  known as: Ativan Take 1 tablet (1 mg total) by mouth 3 (three) times daily as needed for anxiety.   metroNIDAZOLE 500 MG tablet Commonly known as: FLAGYL Take 500 mg by mouth 3 (three) times daily.   oxyCODONE 5 MG immediate release tablet Commonly known as: Oxy IR/ROXICODONE Take 1 tablet (5 mg total) by mouth every 6 (six) hours as needed for breakthrough pain.   pantoprazole 40 MG tablet Commonly known as: PROTONIX Take 40 mg by mouth daily.     polyethylene glycol 17 g packet Commonly known as: MIRALAX / GLYCOLAX Take 17 g by mouth daily as needed for mild constipation.   testosterone cypionate 200 MG/ML injection Commonly known as: DEPOTESTOSTERONE CYPIONATE Inject 200 mg into the muscle every 14 (fourteen) days.         Follow-up Information    [facility]. Go on 11/06/2019.   Specialty: General Surgery Why: 1:45pm. Please arrive 30 minutes earlier for paperwork. Please bring a copy of your photo ID and insurance card to the appointment.  Contact information: [address] [place] [address] [phone]               Signed: Alferd [name], [facility] 10/27/2019, 8:16 AM Please see Amion for pager number during day hours 7:00am-4:30pm

## 2019-10-29 ENCOUNTER — Encounter: Payer: Self-pay | Admitting: *Deleted

## 2019-10-29 ENCOUNTER — Other Ambulatory Visit: Payer: Self-pay | Admitting: *Deleted

## 2019-10-29 NOTE — Patient Outreach (Signed)
[facility]) Care Management  10/29/2019  Jeff [name] 04/16/1966 [id number]   Transition of care call/case closure   Referral received: 10/29/19 Initial outreach: 10/29/19 Insurance: [place]    Subjective: Initial successful telephone call to patient's preferred number in order to complete transition of care assessment; 2 HIPAA identifiers verified. Explained purpose of call and completed transition of care assessment.  Jeff [name] states that he is beginning to feel a lot better. He reports that his pain is managed mostly with prn tylenol. He denies post-operative problems,he reports penrose drain being in place with noted light pink tinged drainage, he denies odor of drainage, no fever. He reports tolerating warm bath 3 times a day and continues taking antibiotics as prescribed. He reports tolerating diet eating foods that help promote bowel movement and having daily bowel movements. He states that his spouse is a Marine scientist and is assisting in his  recovery.    He is unsure if he has  the hospital indemnity plan states that his partner is on top of everything and would have checked on that.  He does use  a [facility] outpatient [facility] at [facility]. Objective:  Jeff [name]  was hospitalized at [facility]  from 6/10-6/12/21 Irrigation and debridement of perirectal abscess.   He was discharged to home on 10/27/19 without the need for home health services or DME.   Assessment:  Patient voices good understanding of all discharge instructions.  See transition of care flowsheet for assessment details.   Plan:  Reviewed hospital discharge diagnosis of Perirectal abscess irrigation and debridement . and discharge treatment plan using hospital discharge instructions, assessing medication adherence, reviewing problems requiring provider notification, and discussing the importance of follow up with surgeon, primary care provider and/or specialists as directed.  Reviewed  [place] healthy lifestyle program information to receive discounted premium for  2022   Step 1: Get  your annual physical  Step 2: Complete your health assessment  Step 3:Identify your current health status and complete the corresponding action step between January 1, and January 16, 2020.   Jeff [name] expressed compliments regarding his hospital stay , provided Office of patient experience to share compliments.    No ongoing care management needs identified so will close case to [place] Management services and route successful outreach letter with [place] Management pamphlet and 24 Hour Nurse Line Magnet to [facility] clinical pool to be mailed to patient's home address.    Joylene Draft, RN, BSN  [place] Management Coordinator  [phone]- Mobile [phone]- Toll Free Main Office

## 2019-10-31 LAB — AEROBIC/ANAEROBIC CULTURE W GRAM STAIN (SURGICAL/DEEP WOUND)

## 2019-11-20 MED FILL — ZOLPIDEM TARTRATE 10 MG TAB: 10 | 90 days supply | Qty: 30 | Fill #0

## 2019-11-27 MED FILL — TESTOSTERONE CYP 200 MG/ML: 200 | 21 days supply | Qty: 3 | Fill #0

## 2019-11-27 MED FILL — FLUTICASONE PROP 50 MCG SPR: 50 | 30 days supply | Qty: 16 | Fill #1

## 2019-12-04 MED FILL — BD 3 ML SYR/NDLE 22G/1-1.5: 22G X 1-1/2 | 25 days supply | Qty: 25 | Fill #0

## 2020-01-07 MED FILL — AMLODIPINE BESYLATE 5 MG TA: 5 | 90 days supply | Qty: 90 | Fill #2

## 2020-01-14 MED FILL — PANTOPRAZOLE SOD DR 40 MG T: 40 | 90 days supply | Qty: 90 | Fill #1

## 2020-01-17 MED FILL — TESTOSTERONE CYP 200 MG/ML: 200 | 21 days supply | Qty: 3 | Fill #1

## 2020-01-29 DIAGNOSIS — R972 Elevated prostate specific antigen [PSA]: Secondary | ICD-10-CM | POA: Diagnosis not present

## 2020-01-29 DIAGNOSIS — E291 Testicular hypofunction: Secondary | ICD-10-CM | POA: Diagnosis not present

## 2020-01-29 DIAGNOSIS — Z0000 Encounter for general adult medical examination without abnormal findings: Secondary | ICD-10-CM | POA: Diagnosis not present

## 2020-04-14 MED FILL — PANTOPRAZOLE SOD DR 40 MG T: 40 | 90 days supply | Qty: 90 | Fill #2

## 2020-04-14 MED FILL — DESVENLAFAXINE SUC ER 50 MG: 50 | 90 days supply | Qty: 90 | Fill #1

## 2020-04-15 ENCOUNTER — Other Ambulatory Visit (HOSPITAL_COMMUNITY): Payer: Self-pay | Admitting: Family Medicine

## 2020-04-15 MED FILL — FLUTICASONE PROP 50 MCG SPR: 50 | 30 days supply | Qty: 16 | Fill #2

## 2020-04-15 MED FILL — AMLODIPINE BESYLATE 5 MG TA: 5 | 90 days supply | Qty: 90 | Fill #0

## 2020-04-16 ENCOUNTER — Other Ambulatory Visit (HOSPITAL_COMMUNITY): Payer: Self-pay | Admitting: Family Medicine

## 2020-04-16 MED FILL — ZOLPIDEM TARTRATE 10 MG TAB: 10 | 90 days supply | Qty: 30 | Fill #0

## 2020-04-16 MED FILL — LORazepam 1 MG TABS: 1 | 30 days supply | Qty: 35 | Fill #0

## 2020-04-18 ENCOUNTER — Other Ambulatory Visit (HOSPITAL_COMMUNITY): Payer: Self-pay | Admitting: Internal Medicine

## 2020-04-18 MED FILL — FLUARIX QUADRIVALENT 0.5 ML: 0.5 | 1 days supply | Qty: 1 | Fill #0

## 2020-06-12 MED FILL — FLUTICASONE PROP 50 MCG SPR: 50 | 30 days supply | Qty: 16 | Fill #3

## 2020-07-15 DIAGNOSIS — H5212 Myopia, left eye: Secondary | ICD-10-CM | POA: Diagnosis not present

## 2020-07-15 DIAGNOSIS — H524 Presbyopia: Secondary | ICD-10-CM | POA: Diagnosis not present

## 2020-07-15 DIAGNOSIS — H52223 Regular astigmatism, bilateral: Secondary | ICD-10-CM | POA: Diagnosis not present

## 2020-07-15 MED FILL — DESVENLAFAXINE SUC ER 50 MG: 50 | 90 days supply | Qty: 90 | Fill #2

## 2020-07-15 MED FILL — PANTOPRAZOLE SOD DR 40 MG T: 40 | 90 days supply | Qty: 90 | Fill #3

## 2020-07-21 ENCOUNTER — Other Ambulatory Visit (HOSPITAL_COMMUNITY): Payer: Self-pay | Admitting: Family Medicine

## 2020-07-21 MED FILL — AMLODIPINE BESYLATE 5 MG TA: 5 | 90 days supply | Qty: 90 | Fill #0

## 2020-07-21 MED FILL — ZOLPIDEM TARTRATE 10 MG TAB: 10 | 90 days supply | Qty: 30 | Fill #0

## 2020-08-06 ENCOUNTER — Other Ambulatory Visit (HOSPITAL_COMMUNITY): Payer: Self-pay | Admitting: Family Medicine

## 2020-08-06 DIAGNOSIS — I10 Essential (primary) hypertension: Secondary | ICD-10-CM | POA: Diagnosis not present

## 2020-08-06 DIAGNOSIS — Z8547 Personal history of malignant neoplasm of testis: Secondary | ICD-10-CM | POA: Diagnosis not present

## 2020-08-06 DIAGNOSIS — E291 Testicular hypofunction: Secondary | ICD-10-CM | POA: Diagnosis not present

## 2020-08-06 DIAGNOSIS — F3341 Major depressive disorder, recurrent, in partial remission: Secondary | ICD-10-CM | POA: Diagnosis not present

## 2020-08-06 DIAGNOSIS — G4700 Insomnia, unspecified: Secondary | ICD-10-CM | POA: Diagnosis not present

## 2020-08-06 DIAGNOSIS — Z125 Encounter for screening for malignant neoplasm of prostate: Secondary | ICD-10-CM | POA: Diagnosis not present

## 2020-08-06 DIAGNOSIS — Z13220 Encounter for screening for lipoid disorders: Secondary | ICD-10-CM | POA: Diagnosis not present

## 2020-08-06 DIAGNOSIS — H9319 Tinnitus, unspecified ear: Secondary | ICD-10-CM | POA: Diagnosis not present

## 2020-08-06 DIAGNOSIS — F410 Panic disorder [episodic paroxysmal anxiety] without agoraphobia: Secondary | ICD-10-CM | POA: Diagnosis not present

## 2020-08-06 DIAGNOSIS — K219 Gastro-esophageal reflux disease without esophagitis: Secondary | ICD-10-CM | POA: Diagnosis not present

## 2020-08-06 DIAGNOSIS — Z0000 Encounter for general adult medical examination without abnormal findings: Secondary | ICD-10-CM | POA: Diagnosis not present

## 2020-08-06 MED FILL — TESTOSTERONE CYP 200 MG/ML: 200 | 21 days supply | Qty: 3 | Fill #0

## 2020-08-06 MED FILL — ESCITALOPRAM 10 MG TABLET: 10 | 30 days supply | Qty: 30 | Fill #0

## 2020-09-02 ENCOUNTER — Other Ambulatory Visit (HOSPITAL_COMMUNITY): Payer: Self-pay

## 2020-09-02 MED ORDER — ESCITALOPRAM OXALATE 10 MG PO TABS
10.0000 mg | ORAL_TABLET | Freq: Every day | ORAL | 0 refills | Status: DC
  Filled 2020-09-02: qty 90, 90d supply, fill #0

## 2020-09-04 ENCOUNTER — Other Ambulatory Visit (HOSPITAL_COMMUNITY): Payer: Self-pay

## 2020-09-05 ENCOUNTER — Other Ambulatory Visit (HOSPITAL_COMMUNITY): Payer: Self-pay

## 2020-10-13 ENCOUNTER — Other Ambulatory Visit (HOSPITAL_COMMUNITY): Payer: Self-pay

## 2020-10-14 ENCOUNTER — Other Ambulatory Visit (HOSPITAL_COMMUNITY): Payer: Self-pay

## 2020-10-14 MED ORDER — FLUTICASONE PROPIONATE 50 MCG/ACT NA SUSP
1.0000 | Freq: Every day | NASAL | 1 refills | Status: AC
  Filled 2020-10-14: qty 16, 30d supply, fill #0

## 2020-10-22 ENCOUNTER — Other Ambulatory Visit (HOSPITAL_COMMUNITY): Payer: Self-pay

## 2020-11-04 ENCOUNTER — Other Ambulatory Visit (HOSPITAL_COMMUNITY): Payer: Self-pay

## 2020-11-04 MED ORDER — FLUTICASONE PROPIONATE 50 MCG/ACT NA SUSP
1.0000 | Freq: Every day | NASAL | 0 refills | Status: DC
  Filled 2020-11-04: qty 48, 90d supply, fill #0

## 2020-11-04 MED ORDER — PANTOPRAZOLE SODIUM 40 MG PO TBEC
40.0000 mg | DELAYED_RELEASE_TABLET | Freq: Every day | ORAL | 0 refills | Status: AC
  Filled 2020-11-04: qty 90, 90d supply, fill #0

## 2020-11-04 MED ORDER — ESCITALOPRAM OXALATE 10 MG PO TABS
10.0000 mg | ORAL_TABLET | Freq: Every day | ORAL | 0 refills | Status: DC
  Filled 2020-11-04 – 2021-01-13 (×2): qty 90, 90d supply, fill #0

## 2020-11-04 MED ORDER — AMLODIPINE BESYLATE 5 MG PO TABS
5.0000 mg | ORAL_TABLET | Freq: Every day | ORAL | 0 refills | Status: DC
  Filled 2020-11-04: qty 90, 90d supply, fill #0

## 2020-11-04 MED ORDER — TESTOSTERONE CYPIONATE 200 MG/ML IM SOLN
INTRAMUSCULAR | 2 refills | Status: DC
  Filled 2020-11-04: qty 3, 21d supply, fill #0
  Filled 2021-02-25: qty 3, 21d supply, fill #1

## 2021-01-13 ENCOUNTER — Other Ambulatory Visit (HOSPITAL_COMMUNITY): Payer: Self-pay

## 2021-01-14 ENCOUNTER — Other Ambulatory Visit (HOSPITAL_COMMUNITY): Payer: Self-pay

## 2021-01-14 MED ORDER — ZOLPIDEM TARTRATE 10 MG PO TABS
10.0000 mg | ORAL_TABLET | Freq: Every evening | ORAL | 0 refills | Status: DC | PRN
  Filled 2021-01-14: qty 30, 30d supply, fill #0

## 2021-02-09 ENCOUNTER — Other Ambulatory Visit (HOSPITAL_COMMUNITY): Payer: Self-pay

## 2021-02-09 DIAGNOSIS — G4700 Insomnia, unspecified: Secondary | ICD-10-CM | POA: Diagnosis not present

## 2021-02-09 DIAGNOSIS — K219 Gastro-esophageal reflux disease without esophagitis: Secondary | ICD-10-CM | POA: Diagnosis not present

## 2021-02-09 DIAGNOSIS — I10 Essential (primary) hypertension: Secondary | ICD-10-CM | POA: Diagnosis not present

## 2021-02-09 DIAGNOSIS — Z131 Encounter for screening for diabetes mellitus: Secondary | ICD-10-CM | POA: Diagnosis not present

## 2021-02-09 DIAGNOSIS — E291 Testicular hypofunction: Secondary | ICD-10-CM | POA: Diagnosis not present

## 2021-02-09 DIAGNOSIS — R7309 Other abnormal glucose: Secondary | ICD-10-CM | POA: Diagnosis not present

## 2021-02-09 DIAGNOSIS — F3341 Major depressive disorder, recurrent, in partial remission: Secondary | ICD-10-CM | POA: Diagnosis not present

## 2021-02-09 DIAGNOSIS — F411 Generalized anxiety disorder: Secondary | ICD-10-CM | POA: Diagnosis not present

## 2021-02-09 DIAGNOSIS — E782 Mixed hyperlipidemia: Secondary | ICD-10-CM | POA: Diagnosis not present

## 2021-02-09 MED ORDER — AMLODIPINE BESYLATE 10 MG PO TABS
10.0000 mg | ORAL_TABLET | Freq: Every day | ORAL | 4 refills | Status: DC
  Filled 2021-02-09: qty 90, 90d supply, fill #0
  Filled 2021-05-12: qty 90, 90d supply, fill #1
  Filled 2021-08-07: qty 90, 90d supply, fill #2
  Filled 2021-11-09: qty 90, 90d supply, fill #3
  Filled 2021-12-01: qty 90, 90d supply, fill #4

## 2021-02-09 MED ORDER — ESCITALOPRAM OXALATE 10 MG PO TABS: 10.0000 mg | ORAL_TABLET | Freq: Every day | ORAL | 4 refills | Status: DC

## 2021-02-09 MED ORDER — BUPROPION HCL ER (XL) 150 MG PO TB24
150.0000 mg | ORAL_TABLET | Freq: Every morning | ORAL | 4 refills | Status: DC
  Filled 2021-02-09: qty 90, 90d supply, fill #0
  Filled 2021-05-12: qty 90, 90d supply, fill #1
  Filled 2021-07-21 – 2021-08-18 (×2): qty 90, 90d supply, fill #2
  Filled 2021-10-27 – 2021-11-06 (×2): qty 90, 90d supply, fill #3

## 2021-02-09 MED ORDER — PANTOPRAZOLE SODIUM 40 MG PO TBEC
40.0000 mg | DELAYED_RELEASE_TABLET | Freq: Every day | ORAL | 4 refills | Status: DC
  Filled 2021-02-09: qty 90, 90d supply, fill #0
  Filled 2021-05-12: qty 90, 90d supply, fill #1
  Filled 2021-09-08: qty 90, 90d supply, fill #2
  Filled 2022-01-11: qty 90, 90d supply, fill #3

## 2021-02-22 ENCOUNTER — Other Ambulatory Visit (HOSPITAL_COMMUNITY): Payer: Self-pay

## 2021-02-23 ENCOUNTER — Other Ambulatory Visit (HOSPITAL_COMMUNITY): Payer: Self-pay

## 2021-02-23 MED ORDER — ZOLPIDEM TARTRATE 10 MG PO TABS
10.0000 mg | ORAL_TABLET | Freq: Every day | ORAL | 0 refills | Status: AC
  Filled 2021-02-23: qty 90, 90d supply, fill #0

## 2021-02-25 ENCOUNTER — Other Ambulatory Visit (HOSPITAL_COMMUNITY): Payer: Self-pay

## 2021-03-31 ENCOUNTER — Other Ambulatory Visit (HOSPITAL_COMMUNITY): Payer: Self-pay

## 2021-03-31 MED ORDER — LORAZEPAM 1 MG PO TABS
1.0000 mg | ORAL_TABLET | Freq: Two times a day (BID) | ORAL | 2 refills | Status: DC | PRN
  Filled 2021-03-31: qty 35, 18d supply, fill #0
  Filled 2021-07-14: qty 35, 18d supply, fill #1
  Filled 2021-08-18: qty 35, 18d supply, fill #2

## 2021-04-23 DIAGNOSIS — R051 Acute cough: Secondary | ICD-10-CM | POA: Diagnosis not present

## 2021-05-12 ENCOUNTER — Other Ambulatory Visit (HOSPITAL_COMMUNITY): Payer: Self-pay

## 2021-07-07 ENCOUNTER — Other Ambulatory Visit (HOSPITAL_COMMUNITY): Payer: Self-pay

## 2021-07-07 DIAGNOSIS — F3341 Major depressive disorder, recurrent, in partial remission: Secondary | ICD-10-CM | POA: Diagnosis not present

## 2021-07-07 DIAGNOSIS — F411 Generalized anxiety disorder: Secondary | ICD-10-CM | POA: Diagnosis not present

## 2021-07-07 DIAGNOSIS — I10 Essential (primary) hypertension: Secondary | ICD-10-CM | POA: Diagnosis not present

## 2021-07-07 DIAGNOSIS — R079 Chest pain, unspecified: Secondary | ICD-10-CM | POA: Diagnosis not present

## 2021-07-07 MED ORDER — ESCITALOPRAM OXALATE 5 MG PO TABS
5.0000 mg | ORAL_TABLET | Freq: Every day | ORAL | 1 refills | Status: DC
  Filled 2021-07-07: qty 30, 30d supply, fill #0
  Filled 2021-07-21: qty 30, 30d supply, fill #1

## 2021-07-07 MED ORDER — LOSARTAN POTASSIUM 25 MG PO TABS
25.0000 mg | ORAL_TABLET | Freq: Every day | ORAL | 1 refills | Status: DC
  Filled 2021-07-07: qty 30, 30d supply, fill #0
  Filled 2021-07-23 – 2021-08-07 (×3): qty 30, 30d supply, fill #1

## 2021-07-14 ENCOUNTER — Other Ambulatory Visit (HOSPITAL_COMMUNITY): Payer: Self-pay

## 2021-07-21 ENCOUNTER — Other Ambulatory Visit (HOSPITAL_COMMUNITY): Payer: Self-pay

## 2021-07-22 ENCOUNTER — Other Ambulatory Visit (HOSPITAL_COMMUNITY): Payer: Self-pay

## 2021-07-23 ENCOUNTER — Other Ambulatory Visit (HOSPITAL_COMMUNITY): Payer: Self-pay

## 2021-07-24 ENCOUNTER — Other Ambulatory Visit (HOSPITAL_COMMUNITY): Payer: Self-pay

## 2021-08-03 ENCOUNTER — Other Ambulatory Visit (HOSPITAL_COMMUNITY): Payer: Self-pay

## 2021-08-07 ENCOUNTER — Other Ambulatory Visit (HOSPITAL_COMMUNITY): Payer: Self-pay

## 2021-08-10 ENCOUNTER — Other Ambulatory Visit (HOSPITAL_COMMUNITY): Payer: Self-pay

## 2021-08-11 ENCOUNTER — Other Ambulatory Visit (HOSPITAL_COMMUNITY): Payer: Self-pay

## 2021-08-11 MED ORDER — TESTOSTERONE CYPIONATE 200 MG/ML IM SOLN
50.0000 mg | INTRAMUSCULAR | 0 refills | Status: AC
  Filled 2021-08-11: qty 3, 21d supply, fill #0

## 2021-08-18 ENCOUNTER — Other Ambulatory Visit (HOSPITAL_COMMUNITY): Payer: Self-pay

## 2021-08-31 ENCOUNTER — Other Ambulatory Visit (HOSPITAL_COMMUNITY): Payer: Self-pay

## 2021-08-31 DIAGNOSIS — Z23 Encounter for immunization: Secondary | ICD-10-CM | POA: Diagnosis not present

## 2021-08-31 DIAGNOSIS — I10 Essential (primary) hypertension: Secondary | ICD-10-CM | POA: Diagnosis not present

## 2021-08-31 DIAGNOSIS — F410 Panic disorder [episodic paroxysmal anxiety] without agoraphobia: Secondary | ICD-10-CM | POA: Diagnosis not present

## 2021-08-31 DIAGNOSIS — E782 Mixed hyperlipidemia: Secondary | ICD-10-CM | POA: Diagnosis not present

## 2021-08-31 DIAGNOSIS — G4700 Insomnia, unspecified: Secondary | ICD-10-CM | POA: Diagnosis not present

## 2021-08-31 DIAGNOSIS — K219 Gastro-esophageal reflux disease without esophagitis: Secondary | ICD-10-CM | POA: Diagnosis not present

## 2021-08-31 DIAGNOSIS — F411 Generalized anxiety disorder: Secondary | ICD-10-CM | POA: Diagnosis not present

## 2021-08-31 DIAGNOSIS — F3341 Major depressive disorder, recurrent, in partial remission: Secondary | ICD-10-CM | POA: Diagnosis not present

## 2021-08-31 DIAGNOSIS — E291 Testicular hypofunction: Secondary | ICD-10-CM | POA: Diagnosis not present

## 2021-08-31 MED ORDER — LOSARTAN POTASSIUM 50 MG PO TABS
50.0000 mg | ORAL_TABLET | Freq: Every day | ORAL | 1 refills | Status: DC
  Filled 2021-08-31: qty 90, 90d supply, fill #0
  Filled 2021-12-01: qty 90, 90d supply, fill #1

## 2021-08-31 MED ORDER — BUPROPION HCL ER (XL) 300 MG PO TB24
300.0000 mg | ORAL_TABLET | Freq: Every morning | ORAL | 4 refills | Status: DC
  Filled 2021-08-31: qty 90, 90d supply, fill #0

## 2021-09-01 ENCOUNTER — Other Ambulatory Visit (HOSPITAL_COMMUNITY): Payer: Self-pay

## 2021-09-08 ENCOUNTER — Other Ambulatory Visit (HOSPITAL_COMMUNITY): Payer: Self-pay

## 2021-09-15 ENCOUNTER — Other Ambulatory Visit (HOSPITAL_COMMUNITY): Payer: Self-pay

## 2021-09-15 DIAGNOSIS — R350 Frequency of micturition: Secondary | ICD-10-CM | POA: Diagnosis not present

## 2021-09-15 DIAGNOSIS — Z113 Encounter for screening for infections with a predominantly sexual mode of transmission: Secondary | ICD-10-CM | POA: Diagnosis not present

## 2021-09-15 MED ORDER — SULFAMETHOXAZOLE-TRIMETHOPRIM 800-160 MG PO TABS
1.0000 | ORAL_TABLET | Freq: Two times a day (BID) | ORAL | 0 refills | Status: DC
  Filled 2021-09-15: qty 14, 7d supply, fill #0

## 2021-10-27 ENCOUNTER — Other Ambulatory Visit (HOSPITAL_COMMUNITY): Payer: Self-pay

## 2021-10-29 ENCOUNTER — Other Ambulatory Visit (HOSPITAL_COMMUNITY): Payer: Self-pay

## 2021-11-06 ENCOUNTER — Other Ambulatory Visit (HOSPITAL_COMMUNITY): Payer: Self-pay

## 2021-11-09 ENCOUNTER — Other Ambulatory Visit (HOSPITAL_COMMUNITY): Payer: Self-pay

## 2021-11-13 ENCOUNTER — Other Ambulatory Visit (HOSPITAL_COMMUNITY): Payer: Self-pay

## 2021-11-13 MED ORDER — LORAZEPAM 1 MG PO TABS
1.0000 mg | ORAL_TABLET | Freq: Two times a day (BID) | ORAL | 2 refills | Status: AC
Start: 2021-11-13 — End: ?
  Filled 2021-11-13: qty 60, 30d supply, fill #0
  Filled 2022-01-11: qty 60, 30d supply, fill #1
  Filled 2022-03-10: qty 60, 30d supply, fill #2

## 2021-11-16 ENCOUNTER — Other Ambulatory Visit (HOSPITAL_COMMUNITY): Payer: Self-pay

## 2021-11-18 ENCOUNTER — Other Ambulatory Visit (HOSPITAL_COMMUNITY): Payer: Self-pay

## 2021-11-18 MED ORDER — TESTOSTERONE CYPIONATE 200 MG/ML IM SOLN
49.0000 mg | INTRAMUSCULAR | 0 refills | Status: DC
  Filled 2021-11-18: qty 1, 28d supply, fill #0

## 2021-11-26 ENCOUNTER — Other Ambulatory Visit (HOSPITAL_COMMUNITY): Payer: Self-pay

## 2021-12-01 ENCOUNTER — Other Ambulatory Visit (HOSPITAL_BASED_OUTPATIENT_CLINIC_OR_DEPARTMENT_OTHER): Payer: Self-pay

## 2021-12-01 ENCOUNTER — Other Ambulatory Visit (HOSPITAL_COMMUNITY): Payer: Self-pay

## 2021-12-03 DIAGNOSIS — Z8547 Personal history of malignant neoplasm of testis: Secondary | ICD-10-CM | POA: Diagnosis not present

## 2021-12-03 DIAGNOSIS — I10 Essential (primary) hypertension: Secondary | ICD-10-CM | POA: Diagnosis not present

## 2021-12-03 DIAGNOSIS — M545 Low back pain, unspecified: Secondary | ICD-10-CM | POA: Diagnosis not present

## 2021-12-09 ENCOUNTER — Other Ambulatory Visit (HOSPITAL_COMMUNITY): Payer: Self-pay

## 2021-12-09 DIAGNOSIS — M25551 Pain in right hip: Secondary | ICD-10-CM | POA: Diagnosis not present

## 2021-12-09 DIAGNOSIS — I10 Essential (primary) hypertension: Secondary | ICD-10-CM | POA: Diagnosis not present

## 2021-12-09 MED ORDER — LOSARTAN POTASSIUM 100 MG PO TABS
100.0000 mg | ORAL_TABLET | Freq: Every day | ORAL | 1 refills | Status: DC
  Filled 2021-12-09: qty 90, 90d supply, fill #0
  Filled 2022-02-10 – 2022-03-10 (×2): qty 90, 90d supply, fill #1

## 2022-01-11 ENCOUNTER — Other Ambulatory Visit (HOSPITAL_COMMUNITY): Payer: Self-pay

## 2022-01-11 MED ORDER — TESTOSTERONE CYPIONATE 200 MG/ML IM SOLN
50.0000 mg | INTRAMUSCULAR | 0 refills | Status: DC
  Filled 2022-01-11: qty 4, 28d supply, fill #0

## 2022-01-14 ENCOUNTER — Other Ambulatory Visit (HOSPITAL_COMMUNITY): Payer: Self-pay

## 2022-01-19 ENCOUNTER — Other Ambulatory Visit (HOSPITAL_BASED_OUTPATIENT_CLINIC_OR_DEPARTMENT_OTHER): Payer: Self-pay | Admitting: Family Medicine

## 2022-01-19 ENCOUNTER — Ambulatory Visit (HOSPITAL_BASED_OUTPATIENT_CLINIC_OR_DEPARTMENT_OTHER)
Admission: RE | Admit: 2022-01-19 | Discharge: 2022-01-19 | Disposition: A | Discharge status: Home / Self Care | Payer: 59 | Source: Ambulatory Visit | Attending: Family Medicine | Admitting: Family Medicine

## 2022-01-19 ENCOUNTER — Other Ambulatory Visit (HOSPITAL_COMMUNITY): Payer: Self-pay | Admitting: Family Medicine

## 2022-01-19 DIAGNOSIS — K3589 Other acute appendicitis without perforation or gangrene: Secondary | ICD-10-CM | POA: Diagnosis not present

## 2022-01-19 DIAGNOSIS — I700 Atherosclerosis of aorta: Secondary | ICD-10-CM | POA: Diagnosis not present

## 2022-01-19 DIAGNOSIS — R1030 Lower abdominal pain, unspecified: Secondary | ICD-10-CM | POA: Insufficient documentation

## 2022-01-19 MED ORDER — IOHEXOL 300 MG/ML  SOLN
100.0000 mL | Freq: Once | INTRAMUSCULAR | Status: AC | PRN
  Administered 2022-01-19: 100 mL via INTRAVENOUS

## 2022-01-20 ENCOUNTER — Other Ambulatory Visit (HOSPITAL_COMMUNITY): Payer: Self-pay

## 2022-01-20 MED ORDER — METRONIDAZOLE 500 MG PO TABS
500.0000 mg | ORAL_TABLET | Freq: Three times a day (TID) | ORAL | 0 refills | Status: AC
  Filled 2022-01-20: qty 30, 10d supply, fill #0

## 2022-01-20 MED ORDER — CIPROFLOXACIN HCL 500 MG PO TABS
500.0000 mg | ORAL_TABLET | Freq: Two times a day (BID) | ORAL | 0 refills | Status: AC
  Filled 2022-01-20: qty 20, 10d supply, fill #0

## 2022-01-21 ENCOUNTER — Ambulatory Visit (HOSPITAL_COMMUNITY): Payer: 59

## 2022-01-21 DIAGNOSIS — K380 Hyperplasia of appendix: Secondary | ICD-10-CM | POA: Diagnosis not present

## 2022-01-27 DIAGNOSIS — Z23 Encounter for immunization: Secondary | ICD-10-CM | POA: Diagnosis not present

## 2022-01-27 DIAGNOSIS — I700 Atherosclerosis of aorta: Secondary | ICD-10-CM | POA: Diagnosis not present

## 2022-01-27 DIAGNOSIS — R935 Abnormal findings on diagnostic imaging of other abdominal regions, including retroperitoneum: Secondary | ICD-10-CM | POA: Diagnosis not present

## 2022-01-29 ENCOUNTER — Encounter (HOSPITAL_COMMUNITY)
Admission: RE | Admit: 2022-01-29 | Discharge: 2022-01-29 | Disposition: A | Discharge status: Home / Self Care | Payer: 59 | Source: Ambulatory Visit | Attending: General Surgery | Admitting: General Surgery

## 2022-01-29 ENCOUNTER — Other Ambulatory Visit: Payer: Self-pay

## 2022-01-29 ENCOUNTER — Encounter (HOSPITAL_COMMUNITY): Payer: Self-pay

## 2022-01-29 VITALS — BP 135/98 | HR 83 | Temp 98.2°F | Resp 18 | Ht 73.0 in | Wt 220.0 lb

## 2022-01-29 DIAGNOSIS — I10 Essential (primary) hypertension: Secondary | ICD-10-CM | POA: Insufficient documentation

## 2022-01-29 DIAGNOSIS — Z01818 Encounter for other preprocedural examination: Secondary | ICD-10-CM | POA: Insufficient documentation

## 2022-01-29 LAB — CBC
HCT: 45.6 % (ref 39.0–52.0)
Hemoglobin: 15.2 g/dL (ref 13.0–17.0)
MCH: 31.5 pg (ref 26.0–34.0)
MCHC: 33.3 g/dL (ref 30.0–36.0)
MCV: 94.6 fL (ref 80.0–100.0)
Platelets: 255 10*3/uL (ref 150–400)
RBC: 4.82 MIL/uL (ref 4.22–5.81)
RDW: 13.3 % (ref 11.5–15.5)
WBC: 6.9 10*3/uL (ref 4.0–10.5)
nRBC: 0 % (ref 0.0–0.2)

## 2022-01-29 LAB — BASIC METABOLIC PANEL
Anion gap: 6 (ref 5–15)
BUN: 17 mg/dL (ref 6–20)
CO2: 26 mmol/L (ref 22–32)
Calcium: 9.4 mg/dL (ref 8.9–10.3)
Chloride: 104 mmol/L (ref 98–111)
Creatinine, Ser: 1.03 mg/dL (ref 0.61–1.24)
GFR, Estimated: >60 mL/min (ref >60)
Glucose, Bld: 109 mg/dL — ABNORMAL HIGH (ref 70–99)
Potassium: 4.2 mmol/L (ref 3.5–5.1)
Sodium: 136 mmol/L (ref 135–145)

## 2022-01-29 NOTE — Patient Instructions (Addendum)
DUE TO COVID-19 ONLY TWO VISITORS  (aged 58 and older)  ARE ALLOWED TO COME WITH YOU AND STAY IN THE WAITING ROOM ONLY DURING PRE OP AND PROCEDURE.   **NO VISITORS ARE ALLOWED IN THE SHORT STAY AREA OR RECOVERY ROOM!!**  IF YOU WILL BE ADMITTED INTO THE HOSPITAL YOU ARE ALLOWED ONLY FOUR SUPPORT PEOPLE DURING VISITATION HOURS ONLY (7 AM -8PM)   The support person(s) must pass our screening, gel in and out, and wear a mask at all times, including in the patient's room. Patients must also wear a mask when staff or their support person are in the room. Visitors GUEST BADGE MUST BE WORN VISIBLY  One adult visitor may remain with you overnight and MUST be in the room by 8 P.M.     Your procedure is scheduled on: 02/02/22   Report to [facility] Main Entrance    Report to admitting at  9:15 AM   Call this number if you have problems the morning of surgery [phone]   Do not eat food or drink :After Midnight.          If you have questions, please contact your surgeon's office.       Oral Hygiene is also important to reduce your risk of infection.                                    Remember - BRUSH YOUR TEETH THE MORNING OF SURGERY WITH YOUR REGULAR TOOTHPASTE    Take these medicines the morning of surgery with A SIP OF WATER: Bupropion (wellbutrin) , Amlodipine, Protonix                                You may not have any metal on your body including  jewelry, and body piercing             Do not wear lotions, powders, cologne, or deodorant               Men may shave face and neck.   Do not bring valuables to the hospital. [address].   Contacts, dentures or bridgework may not be worn into surgery.  DO NOT [place].    Patients discharged on the day of surgery will not be allowed to drive home.  Someone NEEDS to stay with you for the first 24 hours after anesthesia.                  Please read over the following fact sheets you were given: IF YOU HAVE QUESTIONS ABOUT YOUR PRE-OP INSTRUCTIONS PLEASE CALL [phone]     [facility] - Preparing for Surgery Before surgery, you can play an important role.  Because skin is not sterile, your skin needs to be as free of germs as possible.  You can reduce the number of germs on your skin by washing with CHG (chlorahexidine gluconate) soap before surgery.  CHG is an antiseptic cleaner which kills germs and bonds with the skin to continue killing germs even after washing. Please DO NOT use if you have an allergy to CHG or antibacterial soaps.  If your skin becomes reddened/irritated stop using the CHG and inform your nurse  when you arrive at Short Stay..  You may shave your face/neck. Please follow these instructions carefully:  1.  Shower with CHG Soap the night before surgery and the  morning of Surgery.  2.  If you choose to wash your hair, wash your hair first as usual with your  normal  shampoo.  3.  After you shampoo, rinse your hair and body thoroughly to remove the  shampoo.                            4.  Use CHG as you would any other liquid soap.  You can apply chg directly  to the skin and wash                       Gently with a scrungie or clean washcloth.  5.  Apply the CHG Soap to your body ONLY FROM THE NECK DOWN.   Do not use on face/ open                           Wound or open sores. Avoid contact with eyes, ears mouth and genitals (private parts).                       Wash face,  Genitals (private parts) with your normal soap.             6.  Wash thoroughly, paying special attention to the area where your surgery  will be performed.  7.  Thoroughly rinse your body with warm water from the neck down.  8.  DO NOT shower/wash with your normal soap after using and rinsing off  the CHG Soap.             9.  Pat yourself dry with a clean towel.            10.  Wear clean pajamas.            11.  Place clean sheets  on your bed the night of your first shower and do not  sleep with pets. Day of Surgery : Do not apply any lotions/deodorants the morning of surgery.  Please wear clean clothes to the hospital/surgery center.  FAILURE TO FOLLOW THESE INSTRUCTIONS MAY RESULT IN THE CANCELLATION OF YOUR SURGERY   ________________________________________________________________________

## 2022-01-29 NOTE — Progress Notes (Signed)
Anesthesia note:  Bowel prep reminder:NA  PCP - Dr. Lavina Hamman Cardiologist -none Other-   Chest x-ray - no EKG - 01/29/22-chart Stress Test - 2019 ECHO - no Cardiac Cath - no  Pacemaker/ICD device last checked:NA  Sleep Study - no CPAP -   Pt is pre diabetic-NA Fasting Blood Sugar -  Checks Blood Sugar _____  Blood Thinner:NA Blood Thinner Instructions: Aspirin Instructions: Last Dose:  Anesthesia review: no  Patient denies shortness of breath, fever, cough and chest pain at PAT appointment Pt has no SOB with any activities. He wasn't keeping up with the antibiotics but he will after we discussed their importance.  Patient verbalized understanding of instructions that were given to them at the PAT appointment. Patient was also instructed that they will need to review over the PAT instructions again at home before surgery. yes

## 2022-02-01 ENCOUNTER — Ambulatory Visit: Payer: Self-pay | Admitting: General Surgery

## 2022-02-02 ENCOUNTER — Encounter (HOSPITAL_COMMUNITY): Payer: Self-pay | Admitting: General Surgery

## 2022-02-02 ENCOUNTER — Ambulatory Visit (HOSPITAL_COMMUNITY)
Admission: RE | Admit: 2022-02-02 | Discharge: 2022-02-02 | Disposition: A | Discharge status: Home / Self Care | Payer: 59 | Attending: General Surgery | Admitting: General Surgery

## 2022-02-02 ENCOUNTER — Other Ambulatory Visit: Payer: Self-pay

## 2022-02-02 ENCOUNTER — Encounter (HOSPITAL_COMMUNITY)
Admission: RE | Disposition: A | Discharge status: Home / Self Care | Payer: Self-pay | Source: Home / Self Care | Attending: General Surgery

## 2022-02-02 ENCOUNTER — Ambulatory Visit (HOSPITAL_BASED_OUTPATIENT_CLINIC_OR_DEPARTMENT_OTHER): Payer: 59 | Admitting: Certified Registered"

## 2022-02-02 ENCOUNTER — Ambulatory Visit (HOSPITAL_COMMUNITY): Payer: 59 | Admitting: Certified Registered"

## 2022-02-02 ENCOUNTER — Other Ambulatory Visit (HOSPITAL_COMMUNITY): Payer: Self-pay

## 2022-02-02 DIAGNOSIS — K388 Other specified diseases of appendix: Secondary | ICD-10-CM | POA: Diagnosis not present

## 2022-02-02 DIAGNOSIS — K219 Gastro-esophageal reflux disease without esophagitis: Secondary | ICD-10-CM | POA: Diagnosis not present

## 2022-02-02 DIAGNOSIS — I10 Essential (primary) hypertension: Secondary | ICD-10-CM | POA: Insufficient documentation

## 2022-02-02 DIAGNOSIS — E882 Lipomatosis, not elsewhere classified: Secondary | ICD-10-CM | POA: Diagnosis not present

## 2022-02-02 DIAGNOSIS — K380 Hyperplasia of appendix: Secondary | ICD-10-CM | POA: Diagnosis not present

## 2022-02-02 DIAGNOSIS — F419 Anxiety disorder, unspecified: Secondary | ICD-10-CM | POA: Insufficient documentation

## 2022-02-02 HISTORY — PX: LAPAROSCOPIC APPENDECTOMY: SHX408

## 2022-02-02 SURGERY — APPENDECTOMY, LAPAROSCOPIC
Anesthesia: General

## 2022-02-02 MED ORDER — ACETAMINOPHEN 10 MG/ML IV SOLN: 1000.0000 mg | Freq: Once | INTRAVENOUS | Status: DC | PRN

## 2022-02-02 MED ORDER — MIDAZOLAM HCL 2 MG/2ML IJ SOLN
INTRAMUSCULAR | Status: AC
  Filled 2022-02-02: qty 2

## 2022-02-02 MED ORDER — SUCCINYLCHOLINE CHLORIDE 200 MG/10ML IV SOSY: PREFILLED_SYRINGE | INTRAVENOUS | Status: DC | PRN

## 2022-02-02 MED ORDER — ROCURONIUM BROMIDE 10 MG/ML (PF) SYRINGE
PREFILLED_SYRINGE | INTRAVENOUS | Status: DC | PRN
  Administered 2022-02-02: 60 mg via INTRAVENOUS

## 2022-02-02 MED ORDER — OXYCODONE HCL 5 MG PO TABS
ORAL_TABLET | ORAL | Status: AC
  Filled 2022-02-02: qty 1

## 2022-02-02 MED ORDER — PROMETHAZINE HCL 25 MG/ML IJ SOLN: 6.2500 mg | INTRAMUSCULAR | Status: DC | PRN

## 2022-02-02 MED ORDER — FENTANYL CITRATE PF 50 MCG/ML IJ SOSY
PREFILLED_SYRINGE | INTRAMUSCULAR | Status: AC
  Filled 2022-02-02: qty 2

## 2022-02-02 MED ORDER — MIDAZOLAM HCL 2 MG/2ML IJ SOLN
INTRAMUSCULAR | Status: DC | PRN
  Administered 2022-02-02 (×2): 1 mg via INTRAVENOUS

## 2022-02-02 MED ORDER — AMISULPRIDE (ANTIEMETIC) 5 MG/2ML IV SOLN: 10.0000 mg | Freq: Once | INTRAVENOUS | Status: DC | PRN

## 2022-02-02 MED ORDER — DEXAMETHASONE SODIUM PHOSPHATE 10 MG/ML IJ SOLN
INTRAMUSCULAR | Status: AC
  Filled 2022-02-02: qty 1

## 2022-02-02 MED ORDER — PHENYLEPHRINE HCL (PRESSORS) 10 MG/ML IV SOLN
INTRAVENOUS | Status: AC
  Filled 2022-02-02: qty 1

## 2022-02-02 MED ORDER — CHLORHEXIDINE GLUCONATE CLOTH 2 % EX PADS: 6.0000 | MEDICATED_PAD | Freq: Once | CUTANEOUS | Status: DC

## 2022-02-02 MED ORDER — DEXAMETHASONE SODIUM PHOSPHATE 10 MG/ML IJ SOLN
INTRAMUSCULAR | Status: DC | PRN
  Administered 2022-02-02: 10 mg via INTRAVENOUS

## 2022-02-02 MED ORDER — ACETAMINOPHEN 325 MG PO TABS: 325.0000 mg | ORAL_TABLET | ORAL | Status: DC | PRN

## 2022-02-02 MED ORDER — PROPOFOL 10 MG/ML IV BOLUS
INTRAVENOUS | Status: AC
  Filled 2022-02-02: qty 20

## 2022-02-02 MED ORDER — ENSURE PRE-SURGERY PO LIQD
296.0000 mL | Freq: Once | ORAL | Status: DC
  Filled 2022-02-02: qty 296

## 2022-02-02 MED ORDER — FENTANYL CITRATE PF 50 MCG/ML IJ SOSY
PREFILLED_SYRINGE | INTRAMUSCULAR | Status: AC
  Administered 2022-02-02: 50 ug via INTRAVENOUS
  Filled 2022-02-02: qty 1

## 2022-02-02 MED ORDER — LIDOCAINE 2% (20 MG/ML) 5 ML SYRINGE
INTRAMUSCULAR | Status: DC | PRN
  Administered 2022-02-02: 80 mg via INTRAVENOUS

## 2022-02-02 MED ORDER — BUPIVACAINE-EPINEPHRINE 0.25% -1:200000 IJ SOLN
INTRAMUSCULAR | Status: DC | PRN
  Administered 2022-02-02: 30 mL

## 2022-02-02 MED ORDER — CEFAZOLIN SODIUM-DEXTROSE 2-4 GM/100ML-% IV SOLN
2.0000 g | INTRAVENOUS | Status: AC
  Administered 2022-02-02: 2 g via INTRAVENOUS
  Filled 2022-02-02: qty 100

## 2022-02-02 MED ORDER — OXYCODONE HCL 5 MG/5ML PO SOLN: 5.0000 mg | Freq: Once | ORAL | Status: AC | PRN

## 2022-02-02 MED ORDER — PROPOFOL 10 MG/ML IV BOLUS
INTRAVENOUS | Status: DC | PRN
  Administered 2022-02-02: 140 mg via INTRAVENOUS

## 2022-02-02 MED ORDER — BUPIVACAINE-EPINEPHRINE (PF) 0.25% -1:200000 IJ SOLN
INTRAMUSCULAR | Status: AC
  Filled 2022-02-02: qty 30

## 2022-02-02 MED ORDER — ORAL CARE MOUTH RINSE: 15.0000 mL | Freq: Once | OROMUCOSAL | Status: AC

## 2022-02-02 MED ORDER — ONDANSETRON HCL 4 MG/2ML IJ SOLN
INTRAMUSCULAR | Status: DC | PRN
  Administered 2022-02-02: 4 mg via INTRAVENOUS

## 2022-02-02 MED ORDER — ACETAMINOPHEN 500 MG PO TABS
1000.0000 mg | ORAL_TABLET | ORAL | Status: AC
  Administered 2022-02-02: 1000 mg via ORAL
  Filled 2022-02-02: qty 2

## 2022-02-02 MED ORDER — FENTANYL CITRATE PF 50 MCG/ML IJ SOSY
25.0000 ug | PREFILLED_SYRINGE | INTRAMUSCULAR | Status: DC | PRN
  Administered 2022-02-02 (×2): 50 ug via INTRAVENOUS

## 2022-02-02 MED ORDER — FENTANYL CITRATE (PF) 250 MCG/5ML IJ SOLN
INTRAMUSCULAR | Status: DC | PRN
  Administered 2022-02-02: 50 ug via INTRAVENOUS
  Administered 2022-02-02 (×2): 100 ug via INTRAVENOUS

## 2022-02-02 MED ORDER — FENTANYL CITRATE (PF) 100 MCG/2ML IJ SOLN
INTRAMUSCULAR | Status: AC
  Filled 2022-02-02: qty 2

## 2022-02-02 MED ORDER — OXYCODONE HCL 5 MG PO TABS
5.0000 mg | ORAL_TABLET | Freq: Four times a day (QID) | ORAL | 0 refills | Status: AC | PRN
  Filled 2022-02-02: qty 10, 3d supply, fill #0

## 2022-02-02 MED ORDER — ONDANSETRON HCL 4 MG/2ML IJ SOLN
INTRAMUSCULAR | Status: AC
  Filled 2022-02-02: qty 2

## 2022-02-02 MED ORDER — FENTANYL CITRATE (PF) 100 MCG/2ML IJ SOLN
INTRAMUSCULAR | Status: DC | PRN
  Administered 2022-02-02 (×2): 50 ug via INTRAVENOUS

## 2022-02-02 MED ORDER — 0.9 % SODIUM CHLORIDE (POUR BTL) OPTIME
TOPICAL | Status: DC | PRN
  Administered 2022-02-02: 1000 mL

## 2022-02-02 MED ORDER — CHLORHEXIDINE GLUCONATE 0.12 % MT SOLN
15.0000 mL | Freq: Once | OROMUCOSAL | Status: AC
  Administered 2022-02-02: 15 mL via OROMUCOSAL

## 2022-02-02 MED ORDER — ACETAMINOPHEN 160 MG/5ML PO SOLN: 325.0000 mg | ORAL | Status: DC | PRN

## 2022-02-02 MED ORDER — LACTATED RINGERS IV SOLN: INTRAVENOUS | Status: DC

## 2022-02-02 MED ORDER — IBUPROFEN 800 MG PO TABS
800.0000 mg | ORAL_TABLET | Freq: Three times a day (TID) | ORAL | 0 refills | Status: AC | PRN
  Filled 2022-02-02: qty 30, 10d supply, fill #0

## 2022-02-02 MED ORDER — LACTATED RINGERS IR SOLN
Status: DC | PRN
  Administered 2022-02-02: 1000 mL

## 2022-02-02 MED ORDER — OXYCODONE HCL 5 MG PO TABS
5.0000 mg | ORAL_TABLET | Freq: Once | ORAL | Status: AC | PRN
  Administered 2022-02-02: 5 mg via ORAL

## 2022-02-02 MED ORDER — FENTANYL CITRATE (PF) 250 MCG/5ML IJ SOLN
INTRAMUSCULAR | Status: AC
  Filled 2022-02-02: qty 5

## 2022-02-02 MED ORDER — SUGAMMADEX SODIUM 500 MG/5ML IV SOLN
INTRAVENOUS | Status: DC | PRN
  Administered 2022-02-02: 400 mg via INTRAVENOUS

## 2022-02-02 MED ORDER — MIDAZOLAM HCL 2 MG/2ML IJ SOLN: INTRAMUSCULAR | Status: DC | PRN

## 2022-02-02 SURGICAL SUPPLY — 50 items
APPLIER CLIP 5 13 M/L LIGAMAX5 (MISCELLANEOUS)
APPLIER CLIP ROT 10 11.4 M/L (STAPLE)
BAG COUNTER SPONGE SURGICOUNT (BAG) IMPLANT
BENZOIN TINCTURE PRP APPL 2/3 (GAUZE/BANDAGES/DRESSINGS) ×2 IMPLANT
BNDG ADH 1X3 SHEER STRL LF (GAUZE/BANDAGES/DRESSINGS) ×5 IMPLANT
CABLE HIGH FREQUENCY MONO STRZ (ELECTRODE) ×1 IMPLANT
CLIP APPLIE 5 13 M/L LIGAMAX5 (MISCELLANEOUS) IMPLANT
CLIP APPLIE ROT 10 11.4 M/L (STAPLE) IMPLANT
CLIP LIGATING HEMO LOK XL GOLD (MISCELLANEOUS) ×1 IMPLANT
COVER SURGICAL LIGHT HANDLE (MISCELLANEOUS) ×1 IMPLANT
CUTTER FLEX LINEAR 45M (STAPLE) IMPLANT
DERMABOND ADVANCED .7 DNX12 (GAUZE/BANDAGES/DRESSINGS) IMPLANT
DRAIN CHANNEL 19F RND (DRAIN) IMPLANT
DRAPE LAPAROSCOPIC ABDOMINAL (DRAPES) ×1 IMPLANT
ELECT REM PT RETURN 15FT ADLT (MISCELLANEOUS) ×1 IMPLANT
ENDOLOOP SUT PDS II  0 18 (SUTURE)
ENDOLOOP SUT PDS II 0 18 (SUTURE) IMPLANT
EVACUATOR SILICONE 100CC (DRAIN) IMPLANT
GLOVE BIOGEL PI IND STRL 7.0 (GLOVE) ×1 IMPLANT
GLOVE SURG SS PI 7.0 STRL IVOR (GLOVE) ×1 IMPLANT
GOWN STRL REUS W/ TWL LRG LVL3 (GOWN DISPOSABLE) ×1 IMPLANT
GOWN STRL REUS W/ TWL XL LVL3 (GOWN DISPOSABLE) IMPLANT
GOWN STRL REUS W/TWL LRG LVL3 (GOWN DISPOSABLE) ×1
GOWN STRL REUS W/TWL XL LVL3 (GOWN DISPOSABLE)
GRASPER SUT TROCAR 14GX15 (MISCELLANEOUS) IMPLANT
IRRIG SUCT STRYKERFLOW 2 WTIP (MISCELLANEOUS) ×1
IRRIGATION SUCT STRKRFLW 2 WTP (MISCELLANEOUS) ×1 IMPLANT
KIT BASIN OR (CUSTOM PROCEDURE TRAY) ×1 IMPLANT
KIT TURNOVER KIT A (KITS) IMPLANT
PENCIL SMOKE EVACUATOR (MISCELLANEOUS) IMPLANT
POUCH RETRIEVAL ECOSAC 10 (ENDOMECHANICALS) ×1 IMPLANT
POUCH RETRIEVAL ECOSAC 10MM (ENDOMECHANICALS) ×1
RELOAD 45 VASCULAR/THIN (ENDOMECHANICALS) IMPLANT
RELOAD STAPLE 45 2.5 WHT GRN (ENDOMECHANICALS) IMPLANT
RELOAD STAPLE 45 3.5 BLU ETS (ENDOMECHANICALS) IMPLANT
RELOAD STAPLE TA45 3.5 REG BLU (ENDOMECHANICALS) IMPLANT
SCISSORS LAP 5X35 DISP (ENDOMECHANICALS) ×1 IMPLANT
SET TUBE SMOKE EVAC HIGH FLOW (TUBING) ×1 IMPLANT
SHEARS HARMONIC ACE PLUS 36CM (ENDOMECHANICALS) IMPLANT
SLEEVE Z-THREAD 5X100MM (TROCAR) ×1 IMPLANT
SPIKE FLUID TRANSFER (MISCELLANEOUS) ×1 IMPLANT
STRIP CLOSURE SKIN 1/2X4 (GAUZE/BANDAGES/DRESSINGS) ×1 IMPLANT
STRIP CLOSURE SKIN 1/4X4 (GAUZE/BANDAGES/DRESSINGS) ×1 IMPLANT
SUT ETHILON 2 0 PS N (SUTURE) IMPLANT
SUT MNCRL AB 4-0 PS2 18 (SUTURE) ×1 IMPLANT
TOWEL OR 17X26 10 PK STRL BLUE (TOWEL DISPOSABLE) ×1 IMPLANT
TOWEL OR NON WOVEN STRL DISP B (DISPOSABLE) ×1 IMPLANT
TRAY LAPAROSCOPIC (CUSTOM PROCEDURE TRAY) ×1 IMPLANT
TROCAR ADV FIXATION 12X100MM (TROCAR) ×1 IMPLANT
TROCAR Z-THREAD OPTICAL 5X100M (TROCAR) ×1 IMPLANT

## 2022-02-02 NOTE — Anesthesia Procedure Notes (Signed)
Procedure Name: Intubation Date/Time: 02/02/2022 10:26 AM  Performed by: Cynda Familia, CRNAPre-anesthesia Checklist: Patient identified, Emergency Drugs available, Suction available and Patient being monitored Patient Re-evaluated:Patient Re-evaluated prior to induction Oxygen Delivery Method: Circle System Utilized Preoxygenation: Pre-oxygenation with 100% oxygen Induction Type: IV induction and Cricoid Pressure applied Ventilation: Mask ventilation without difficulty Grade View: Grade I Tube type: Oral Tube size: 7.5 mm Number of attempts: 1 Airway Equipment and Method: Stylet Placement Confirmation: ETT inserted through vocal cords under direct vision, positive ETCO2 and breath sounds checked- equal and bilateral Secured at: 22 cm Tube secured with: Tape Dental Injury: Teeth and Oropharynx as per pre-operative assessment  Comments: Smooth IV induction Jeff [name]- intubation AM CRNA atraumatic-- teeth and mouth as preop bilat BS

## 2022-02-02 NOTE — H&P (Signed)
Chief Complaint: New Consultation (Appendicitis/)   History of Present Illness: Jeff [name] is a 56 y.o. male who is seen today as an office consultation for evaluation of New Consultation (Appendicitis/) .  He has had 1 month of right lower abdominal or groin pain. Pain can become moderately intense or back down to nothing. He denies nausea or vomiting. He denies constipation or diarrhea.  Review of Systems: A complete review of systems was obtained from the patient. I have reviewed this information and discussed as appropriate with the patient. See HPI as well for other ROS.  Review of Systems  Constitutional: Negative.  HENT: Negative.  Eyes: Negative.  Respiratory: Negative.  Cardiovascular: Negative.  Gastrointestinal: Negative.  Genitourinary: Negative.  Musculoskeletal: Negative.  Skin: Negative.  Neurological: Negative.  Endo/Heme/Allergies: Negative.  Psychiatric/Behavioral: Negative.   Medical History: Past Medical History:  Diagnosis Date  Anxiety  History of cancer  Hypertension   There is no problem list on file for this patient.  Past Surgical History:  Procedure Laterality Date  TESTICAL SURGERY 2011  I&D PERIRECTAL ABSCESS 10/26/2019  WISDOM TEETH    Allergies  Allergen Reactions  Penicillins Unknown  Childhood allergy   Current Outpatient Medications on File Prior to Visit  Medication Sig Dispense Refill  amLODIPine (NORVASC) 10 MG tablet Take 1 tablet by mouth once daily  buPROPion (WELLBUTRIN XL) 150 MG XL tablet Take by mouth  desvenlafaxine succinate (PRISTIQ) 50 MG ER tablet Take 50 mg by mouth once daily  escitalopram oxalate (LEXAPRO) 10 MG tablet Take 1 tablet by mouth once daily  fluticasone propionate (FLONASE) 50 mcg/actuation nasal spray Place into one nostril  losartan (COZAAR) 100 MG tablet Take 100 mg by mouth once daily  pantoprazole (PROTONIX) 40 MG DR tablet Take 1 tablet by mouth once daily  testosterone cypionate  (DEPO-TESTOSTERONE) 200 mg/mL injection INJECT 0.25 ML INTRAMUSCULAR ONCE A WEEK  zolpidem (AMBIEN) 10 mg tablet Take by mouth   No current facility-administered medications on file prior to visit.   History reviewed. No pertinent family history.   Social History   Tobacco Use  Smoking Status Never  Smokeless Tobacco Never    Social History   Socioeconomic History  Marital status: Married  Tobacco Use  Smoking status: Never  Smokeless tobacco: Never  Substance and Sexual Activity  Alcohol use: Not Currently  Drug use: Never   Objective:   Vitals:  01/21/22 1419  BP: (!) 160/100  Pulse: 98  Temp: 37.2 C (98.9 F)  SpO2: 98%  Weight: (!) 101.2 kg (223 lb)  Height: 185.4 cm ('6\' 1"'$ )   Body mass index is 29.42 kg/m.  Physical Exam Constitutional:  Appearance: Normal appearance.  HENT:  Head: Normocephalic and atraumatic.  Pulmonary:  Effort: Pulmonary effort is normal.  Musculoskeletal:  General: Normal range of motion.  Cervical back: Normal range of motion.  Neurological:  General: No focal deficit present.  Mental Status: He is alert and oriented to person, place, and time. Mental status is at baseline.  Psychiatric:  Mood and Affect: Mood normal.  Behavior: Behavior normal.  Thought Content: Thought content normal.   Labs, Imaging and Diagnostic Testing: I reviewed images of the CT scan  Assessment and Plan:   Diagnoses and all orders for this visit:  Hyperplasia of appendix  He has vague RLQ pain and dilated appendix on imaging. He does not present with the typical acute abdomen picture. He has no hernias on exam. We discussed the abnormal findings and I  think the best plan is to proceed with outpatient laparoscopic appendectomy.   The anatomy and the physiology was discussed. The pathophysiology and natural history of the disease was discussed. Options were discussed and recommendations were made. Technique, risks, benefits, & alternatives were  discussed. Questions answered. The patient agrees to proceed.

## 2022-02-02 NOTE — Transfer of Care (Signed)
Immediate Anesthesia Transfer of Care Note  Patient: Jeff [name]  Procedure(s) Performed: APPENDECTOMY LAPAROSCOPIC  Patient Location: PACU  Anesthesia Type:General  Level of Consciousness: awake and alert   Airway & Oxygen Therapy: Patient Spontanous Breathing and Patient connected to face mask oxygen  Post-op Assessment: Report given to RN and Post -op Vital signs reviewed and stable  Post vital signs: Reviewed and stable  Last Vitals:  Vitals Value Taken Time  BP    Temp    Pulse    Resp    SpO2      Last Pain:  Vitals:   02/02/22 0930  TempSrc:   PainSc: 0-No pain         Complications: No notable events documented.

## 2022-02-02 NOTE — Op Note (Signed)
Preoperative diagnosis: dilated appendix  Postoperative diagnosis: Same   Procedure: laparoscopic appendectomy  Surgeon: Gurney Maxin, M.D.  Asst: none  Anesthesia: Gen.   Indications for procedure: Jeff [name] is a 56 y.o. male with symptoms of vague pain , CT showed dilated appendix. He did not have classic acute appendicitis progression or exam but decision to proceed with appendectomy  Description of procedure: The patient was brought into the operative suite, placed supine. Anesthesia was administered with endotracheal tube. The patient's left arm was tucked. All pressure points were offloaded by foam padding. The patient was prepped and draped in the usual sterile fashion.  A transverse incision was made to the left of the umbilicus and a 14m trocar was [place] Pneumoperitoneum was applied with high flow low pressure.  2 540mtrocars were placed, one in the suprapubic space, one in the LLQ, the periumbilical incision was then up-sized and a 1268mrocar placed in that space.  A transversus abdominal block was placed on the left and right sides. Next, the patient was placed in trendelenberg, rotated to the left. The omentum was retracted cephalad. The cecum and appendix were identified. The appendix was dilated. There were multiple adhesions to the cecum and terminal ileum that were taken down with cautery. The base of the appendix was dissected and a window through the mesoappendix was created with blunt dissection. Large Hem-o-lock clips were used to doubly ligate the base of the appendix and mesoappendix. The appendix was cut free with scissors.  The appendix was placed in a specimen bag. The pelvis and RLQ were irrigated. The appendix was removed via the 12 mm trocar. 0 vicryl was used to close the fascial defect. Pneumoperitoneum was removed, all trocars were removed. All incisions were closed with 4-0 monocryl subcuticular stitch. The patient woke from anesthesia and was brought to PACU in  stable condition.  Findings: dilated appendix  Specimen: appendix  Blood loss: 20 ml  Local anesthesia: 30 ml Marcaine  Complications: none  LukGurney Maxin.D. General, Bariatric, & Minimally Invasive Surgery [facility]

## 2022-02-02 NOTE — Anesthesia Postprocedure Evaluation (Signed)
Anesthesia Post Note  Patient: ODESSA MORREN  Procedure(s) Performed: APPENDECTOMY LAPAROSCOPIC     Patient location during evaluation: PACU Anesthesia Type: General Level of consciousness: awake and alert Pain management: pain level controlled Vital Signs Assessment: post-procedure vital signs reviewed and stable Respiratory status: spontaneous breathing, nonlabored ventilation, respiratory function stable and patient connected to nasal cannula oxygen Cardiovascular status: blood pressure returned to baseline and stable Postop Assessment: no apparent nausea or vomiting Anesthetic complications: no   No notable events documented.  Last Vitals:  Vitals:   02/02/22 1245 02/02/22 1300  BP: 134/86   Pulse: (!) 57 61  Resp:    Temp:    SpO2: 92% 95%    Last Pain:  Vitals:   02/02/22 1221  TempSrc:   PainSc: [place] Omesha Bowerman

## 2022-02-02 NOTE — Anesthesia Preprocedure Evaluation (Addendum)
Anesthesia Evaluation  Patient identified by MRN, date of birth, ID band Patient awake    Reviewed: Allergy & Precautions, NPO status , Patient's Chart, lab work & pertinent test results  Airway Mallampati: II  TM Distance: >3 FB Neck ROM: Full    Dental  (+) Teeth Intact, Dental Advisory Given, Caps   Pulmonary neg pulmonary ROS,    breath sounds clear to auscultation       Cardiovascular hypertension, Pt. on medications  Rhythm:Regular Rate:Normal     Neuro/Psych PSYCHIATRIC DISORDERS Anxiety negative neurological ROS     GI/Hepatic Neg liver ROS, GERD  Medicated,  Endo/Other  negative endocrine ROS  Renal/GU negative Renal ROS     Musculoskeletal negative musculoskeletal ROS (+)   Abdominal Normal abdominal exam  (+)   Peds  Hematology negative hematology ROS (+)   Anesthesia Other Findings   Reproductive/Obstetrics                            Anesthesia Physical Anesthesia Plan  ASA: 2  Anesthesia Plan: General   Post-op Pain Management: Tylenol PO (pre-op)*   Induction: Intravenous  PONV Risk Score and Plan: 3 and Ondansetron, Dexamethasone and Midazolam  Airway Management Planned: Oral ETT  Additional Equipment: None  Intra-op Plan:   Post-operative Plan: Extubation in OR  Informed Consent: I have reviewed the patients History and Physical, chart, labs and discussed the procedure including the risks, benefits and alternatives for the proposed anesthesia with the patient or authorized representative who has indicated his/her understanding and acceptance.     Dental advisory given  Plan Discussed with: CRNA  Anesthesia Plan Comments:        Anesthesia Quick Evaluation

## 2022-02-03 ENCOUNTER — Encounter (HOSPITAL_COMMUNITY): Payer: Self-pay | Admitting: General Surgery

## 2022-02-03 LAB — SURGICAL PATHOLOGY

## 2022-02-04 ENCOUNTER — Telehealth: Payer: Self-pay | Admitting: General Surgery

## 2022-02-10 ENCOUNTER — Other Ambulatory Visit (HOSPITAL_COMMUNITY): Payer: Self-pay

## 2022-02-11 ENCOUNTER — Other Ambulatory Visit (HOSPITAL_COMMUNITY): Payer: Self-pay

## 2022-02-11 MED ORDER — AMLODIPINE BESYLATE 10 MG PO TABS
10.0000 mg | ORAL_TABLET | Freq: Every day | ORAL | 3 refills | Status: AC
  Filled 2022-02-11: qty 90, 90d supply, fill #0
  Filled 2022-05-12: qty 90, 90d supply, fill #1

## 2022-02-11 MED ORDER — BUPROPION HCL ER (XL) 150 MG PO TB24
150.0000 mg | ORAL_TABLET | Freq: Every morning | ORAL | 4 refills | Status: DC
  Filled 2022-02-11: qty 90, 90d supply, fill #0
  Filled 2022-05-12: qty 90, 90d supply, fill #1
  Filled 2022-11-05: qty 90, 90d supply, fill #2

## 2022-02-16 ENCOUNTER — Other Ambulatory Visit (HOSPITAL_COMMUNITY): Payer: Self-pay

## 2022-02-16 MED ORDER — NYSTATIN 100000 UNIT/ML MT SUSP
4.0000 mL | Freq: Four times a day (QID) | OROMUCOSAL | 0 refills | Status: AC
  Filled 2022-02-16: qty 160, 10d supply, fill #0

## 2022-03-10 ENCOUNTER — Other Ambulatory Visit (HOSPITAL_COMMUNITY): Payer: Self-pay

## 2022-03-29 DIAGNOSIS — Z6829 Body mass index (BMI) 29.0-29.9, adult: Secondary | ICD-10-CM | POA: Diagnosis not present

## 2022-03-29 DIAGNOSIS — K219 Gastro-esophageal reflux disease without esophagitis: Secondary | ICD-10-CM | POA: Diagnosis not present

## 2022-03-29 DIAGNOSIS — R079 Chest pain, unspecified: Secondary | ICD-10-CM | POA: Diagnosis not present

## 2022-04-19 ENCOUNTER — Other Ambulatory Visit (HOSPITAL_COMMUNITY): Payer: Self-pay

## 2022-04-20 ENCOUNTER — Other Ambulatory Visit (HOSPITAL_COMMUNITY): Payer: Self-pay

## 2022-04-20 MED ORDER — PANTOPRAZOLE SODIUM 40 MG PO TBEC
40.0000 mg | DELAYED_RELEASE_TABLET | Freq: Every day | ORAL | 0 refills | Status: DC
  Filled 2022-04-20: qty 90, 90d supply, fill #0

## 2022-04-20 MED ORDER — LORAZEPAM 1 MG PO TABS
1.0000 mg | ORAL_TABLET | Freq: Two times a day (BID) | ORAL | 2 refills | Status: DC | PRN
  Filled 2022-04-20: qty 60, 30d supply, fill #0
  Filled 2022-08-10: qty 60, 30d supply, fill #1

## 2022-04-20 MED ORDER — TESTOSTERONE CYPIONATE 200 MG/ML IM SOLN
50.0000 mg | INTRAMUSCULAR | 0 refills | Status: AC
  Filled 2022-04-20: qty 4, 28d supply, fill #0
  Filled 2022-05-12: qty 4, 28d supply, fill #1

## 2022-04-21 ENCOUNTER — Other Ambulatory Visit (HOSPITAL_COMMUNITY): Payer: Self-pay

## 2022-05-03 ENCOUNTER — Ambulatory Visit: Payer: 59 | Admitting: Cardiovascular Disease

## 2022-05-04 ENCOUNTER — Ambulatory Visit: Payer: 59 | Attending: Cardiovascular Disease | Admitting: Cardiovascular Disease

## 2022-05-04 ENCOUNTER — Encounter: Payer: Self-pay | Admitting: Cardiovascular Disease

## 2022-05-04 VITALS — BP 142/88 | HR 88 | Ht 72.0 in | Wt 225.6 lb

## 2022-05-04 DIAGNOSIS — Z8249 Family history of ischemic heart disease and other diseases of the circulatory system: Secondary | ICD-10-CM | POA: Diagnosis not present

## 2022-05-04 DIAGNOSIS — R0789 Other chest pain: Secondary | ICD-10-CM | POA: Diagnosis not present

## 2022-05-04 DIAGNOSIS — E782 Mixed hyperlipidemia: Secondary | ICD-10-CM

## 2022-05-04 DIAGNOSIS — I10 Essential (primary) hypertension: Secondary | ICD-10-CM

## 2022-05-04 DIAGNOSIS — E785 Hyperlipidemia, unspecified: Secondary | ICD-10-CM | POA: Insufficient documentation

## 2022-05-04 NOTE — Assessment & Plan Note (Signed)
History of mild hyperlipidemia with lipid profile performed/17/23 revealing total cholesterol 202, LDL 116 and HDL 66.

## 2022-05-04 NOTE — Assessment & Plan Note (Signed)
Mother had CABG in her late 60s/early 84s.

## 2022-05-04 NOTE — Assessment & Plan Note (Signed)
History of essential hypertension a blood pressure measured today at 142/88.  He is on amlodipine and losartan.

## 2022-05-04 NOTE — Progress Notes (Signed)
05/04/2022 Jeff [name]   March 22, 1966  [id number]  Primary Physician Lawerance Cruel, MD Primary Cardiologist: Lorretta Harp MD Lupe Carney, [place]  HPI:  Jeff [name] is a 56 y.o. thin-appearing married Caucasian male with no children who is a frequent vendor.  He was referred by Dr. Harrington Challenger, his PCP for atypical chest pain.  He did see Dr. Candee Furbish in the office 03/13/2018 for atypical chest pain and had a GXT at that time 04/18/2018 which was normal.  His risk factors include treated hypertension, untreated mild hyperlipidemia and family history with a mother who had CABG in her late 60s/early 34s.  He is never had a heart attack or stroke.  He apparently had appendectomy 2 months ago and since that time he had atypical tingling in his chest occurring for seconds at a time not brought on by any activity.   Current Meds  Medication Sig   acetaminophen (TYLENOL) 500 MG tablet Take 500 mg by mouth every 6 (six) hours as needed for moderate pain or mild pain.   amLODipine (NORVASC) 10 MG tablet Take 1 tablet (10 mg total) by mouth daily.   buPROPion (WELLBUTRIN XL) 150 MG 24 hr tablet Take 1 tablet (150 mg total) by mouth every morning.   Cyanocobalamin (B-12 PO) Take 1 tablet by mouth daily.   docusate sodium (COLACE) 100 MG capsule Take 1 capsule (100 mg total) by mouth 2 (two) times daily as needed for mild constipation.   fluticasone (FLONASE) 50 MCG/ACT nasal spray Place 1-2 sprays into both nostrils daily. (Patient taking differently: Place 1-2 sprays into both nostrils daily as needed for allergies or rhinitis.)   ibuprofen (ADVIL) 800 MG tablet Take 1 tablet (800 mg total) by mouth every 8 (eight) hours as needed.   LORazepam (ATIVAN) 1 MG tablet Take 1 tablet (1 mg total) by mouth 2 (two) times daily if needed (Patient taking differently: Take 1 mg by mouth 2 (two) times daily as needed for anxiety.)   LORazepam (ATIVAN) 1 MG tablet Take 1 tablet (1 mg total) by mouth 2  (two) times daily as needed.   losartan (COZAAR) 100 MG tablet Take 1 tablet (100 mg total) by mouth daily.   nystatin (MYCOSTATIN) 100000 UNIT/ML suspension Swish and swallow 4 mLs (400,000 Units total) 4 (four) times daily for 10 days   oxyCODONE (OXY IR/ROXICODONE) 5 MG immediate release tablet Take 1 tablet (5 mg total) by mouth every 6 (six) hours as needed for severe pain.   pantoprazole (PROTONIX) 40 MG tablet Take 1 tablet (40 mg total) by mouth daily.   pantoprazole (PROTONIX) 40 MG tablet Take 1 tablet (40 mg total) by mouth daily.   polyethylene glycol (MIRALAX / GLYCOLAX) 17 g packet Take 17 g by mouth daily as needed for mild constipation.   testosterone cypionate (DEPOTESTOSTERONE CYPIONATE) 200 MG/ML injection Inject 0.25 mLs (50 mg total) into the muscle once a week.   testosterone cypionate (DEPOTESTOSTERONE CYPIONATE) 200 MG/ML injection Inject 0.25 mLs (50 mg total) into the muscle once a week.   zolpidem (AMBIEN) 10 MG tablet Take 1 tablet (10 mg total) by mouth at bedtime as needed (Patient taking differently: Take 10 mg by mouth at bedtime as needed for sleep.)     Allergies  Allergen Reactions   Penicillins Other (See Comments)    Childhood allergy     Social History   Socioeconomic History   Marital status: Married    Spouse  name: Not on file   Number of children: Not on file   Years of education: Not on file   Highest education level: Not on file  Occupational History   Not on file  Tobacco Use   Smoking status: Never   Smokeless tobacco: Never  Vaping Use   Vaping Use: Never used  Substance and Sexual Activity   Alcohol use: Yes    Alcohol/week: 7.0 standard drinks of alcohol    Types: 7 Standard drinks or equivalent per week   Drug use: No   Sexual activity: Not on file  Other Topics Concern   Not on file  Social History Narrative   Not on file   Social Determinants of Health   Financial Resource Strain: Not on file  Food Insecurity: Not on  file  Transportation Needs: Not on file  Physical Activity: Not on file  Stress: Not on file  Social Connections: Not on file  Intimate Partner Violence: Not on file     Review of Systems: General: negative for chills, fever, night sweats or weight changes.  Cardiovascular: negative for chest pain, dyspnea on exertion, edema, orthopnea, palpitations, paroxysmal nocturnal dyspnea or shortness of breath Dermatological: negative for rash Respiratory: negative for cough or wheezing Urologic: negative for hematuria Abdominal: negative for nausea, vomiting, diarrhea, bright red blood per rectum, melena, or hematemesis Neurologic: negative for visual changes, syncope, or dizziness All other systems reviewed and are otherwise negative except as noted above.    Blood pressure (!) 142/88, pulse 88, height 6' (1.829 m), weight 225 lb 9.6 oz (102.3 kg), SpO2 95 %.  General appearance: alert and no distress Neck: no adenopathy, no carotid bruit, no JVD, supple, symmetrical, trachea midline, and thyroid not enlarged, symmetric, no tenderness/mass/nodules Lungs: clear to auscultation bilaterally Heart: regular rate and rhythm, S1, S2 normal, no murmur, click, rub or gallop Extremities: extremities normal, atraumatic, no cyanosis or edema Pulses: 2+ and symmetric Skin: Skin color, texture, turgor normal. No rashes or lesions Neurologic: Grossly normal  EKG sinus rhythm at 88 without ST or T wave changes.  I personally reviewed this EKG.  ASSESSMENT AND PLAN:   Atypical chest pain Jeff [name] was evaluated by Candee Furbish MD back in 2019 and had negative GXT 04/18/2018.  He has risk factors including untreated mild hyperlipidemia, treated hypertension and family history.  Since having appendectomy 2 months ago he is noticed brief fleeting tingling sensations in his chest which do not sound ischemically mediated.  I am going to get a coronary calcium score to further evaluate.  Essential  hypertension History of essential hypertension a blood pressure measured today at 142/88.  He is on amlodipine and losartan.  Hyperlipidemia History of mild hyperlipidemia with lipid profile performed/17/23 revealing total cholesterol 202, LDL 116 and HDL 66.  Family history of heart disease Mother had CABG in her late 60s/early 11s.     Lorretta Harp MD FACP,FACC,FAHA, [facility] 05/04/2022 11:18 AM

## 2022-05-04 NOTE — Patient Instructions (Signed)
Medication Instructions:  Your physician recommends that you continue on your current medications as directed. Please refer to the Current Medication list given to you today.  *If you need a refill on your cardiac medications before your next appointment, please call your pharmacy*   Testing/Procedures: Dr. Gwenlyn Found has ordered a CT coronary calcium score.   Test locations:  [place]   This is $99 out of pocket.   Coronary CalciumScan A coronary calcium scan is an imaging test used to look for deposits of calcium and other fatty materials (plaques) in the inner lining of the blood vessels of the heart (coronary arteries). These deposits of calcium and plaques can partly clog and narrow the coronary arteries without producing any symptoms or warning signs. This puts a person at risk for a heart attack. This test can detect these deposits before symptoms develop. Tell a health care provider about: Any allergies you have. All medicines you are taking, including vitamins, herbs, eye drops, creams, and over-the-counter medicines. Any problems you or family members have had with anesthetic medicines. Any blood disorders you have. Any surgeries you have had. Any medical conditions you have. Whether you are pregnant or may be pregnant. What are the risks? Generally, this is a safe procedure. However, problems may occur, including: Harm to a pregnant woman and her unborn baby. This test involves the use of radiation. Radiation exposure can be dangerous to a pregnant woman and her unborn baby. If you are pregnant, you generally should not have this procedure done. Slight increase in the risk of cancer. This is because of the radiation involved in the test. What happens before the procedure? No preparation is needed for this procedure. What happens during the procedure? You will undress and remove any jewelry around your neck or chest. You will put on a hospital  gown. Sticky electrodes will be placed on your chest. The electrodes will be connected to an electrocardiogram (ECG) machine to record a tracing of the electrical activity of your heart. A CT scanner will take pictures of your heart. During this time, you will be asked to lie still and hold your breath for 2-3 seconds while a picture of your heart is being taken. The procedure may vary among health care providers and hospitals. What happens after the procedure? You can get dressed. You can return to your normal activities. It is up to you to get the results of your test. Ask your health care provider, or the department that is doing the test, when your results will be ready. Summary A coronary calcium scan is an imaging test used to look for deposits of calcium and other fatty materials (plaques) in the inner lining of the blood vessels of the heart (coronary arteries). Generally, this is a safe procedure. Tell your health care provider if you are pregnant or may be pregnant. No preparation is needed for this procedure. A CT scanner will take pictures of your heart. You can return to your normal activities after the scan is done. This information is not intended to replace advice given to you by your health care provider. Make sure you discuss any questions you have with your health care provider. Document Released: 10/30/2007 Document Revised: 03/22/2016 Document Reviewed: 03/22/2016 Elsevier Interactive Patient Education  2017 [place]: At [facility], you and your health needs are our priority.  As part of our continuing mission to provide you with exceptional heart care, we have created  designated Provider Care Teams.  These Care Teams include your primary Cardiologist (physician) and Advanced Practice Providers (APPs -  Physician Assistants and Nurse Practitioners) who all work together to provide you with the care you need, when you need it.  We recommend  signing up for the patient portal called "MyChart".  Sign up information is provided on this After Visit Summary.  MyChart is used to connect with patients for Virtual Visits (Telemedicine).  Patients are able to view lab/test results, encounter notes, upcoming appointments, etc.  Non-urgent messages can be sent to your provider as well.   To learn more about what you can do with MyChart, go to [email or web address].    Your next appointment:   We will see you on an as needed basis.   Provider:   Quay Burow, MD

## 2022-05-04 NOTE — Assessment & Plan Note (Signed)
Jeff [name] was evaluated by Candee Furbish MD back in 2019 and had negative GXT 04/18/2018.  He has risk factors including untreated mild hyperlipidemia, treated hypertension and family history.  Since having appendectomy 2 months ago he is noticed brief fleeting tingling sensations in his chest which do not sound ischemically mediated.  I am going to get a coronary calcium score to further evaluate.

## 2022-05-05 NOTE — Telephone Encounter (Signed)
Phone call

## 2022-05-12 ENCOUNTER — Other Ambulatory Visit (HOSPITAL_COMMUNITY): Payer: Self-pay

## 2022-05-12 ENCOUNTER — Other Ambulatory Visit: Payer: Self-pay

## 2022-05-12 MED ORDER — PANTOPRAZOLE SODIUM 40 MG PO TBEC
40.0000 mg | DELAYED_RELEASE_TABLET | Freq: Every day | ORAL | 0 refills | Status: DC
  Filled 2022-05-12 – 2022-08-10 (×2): qty 90, 90d supply, fill #0

## 2022-05-13 ENCOUNTER — Other Ambulatory Visit: Payer: Self-pay

## 2022-06-15 ENCOUNTER — Other Ambulatory Visit (HOSPITAL_COMMUNITY): Payer: Self-pay

## 2022-06-15 MED ORDER — LOSARTAN POTASSIUM 100 MG PO TABS
100.0000 mg | ORAL_TABLET | Freq: Every day | ORAL | 1 refills | Status: DC
  Filled 2022-06-15: qty 90, 90d supply, fill #0
  Filled 2022-08-24: qty 90, 90d supply, fill #1

## 2022-06-16 ENCOUNTER — Other Ambulatory Visit (HOSPITAL_COMMUNITY): Payer: Self-pay

## 2022-06-16 DIAGNOSIS — R21 Rash and other nonspecific skin eruption: Secondary | ICD-10-CM | POA: Diagnosis not present

## 2022-06-16 DIAGNOSIS — Z6830 Body mass index (BMI) 30.0-30.9, adult: Secondary | ICD-10-CM | POA: Diagnosis not present

## 2022-06-16 DIAGNOSIS — I10 Essential (primary) hypertension: Secondary | ICD-10-CM | POA: Diagnosis not present

## 2022-06-16 DIAGNOSIS — R609 Edema, unspecified: Secondary | ICD-10-CM | POA: Diagnosis not present

## 2022-06-16 MED ORDER — CLOBETASOL PROPIONATE 0.05 % EX CREA
1.0000 | TOPICAL_CREAM | Freq: Two times a day (BID) | CUTANEOUS | 1 refills | Status: AC
  Filled 2022-06-16: qty 30, 15d supply, fill #0

## 2022-06-16 MED ORDER — AMLODIPINE BESYLATE 5 MG PO TABS
5.0000 mg | ORAL_TABLET | Freq: Every day | ORAL | 3 refills | Status: DC
  Filled 2022-06-16: qty 90, 90d supply, fill #0
  Filled 2022-08-24 – 2022-09-17 (×3): qty 90, 90d supply, fill #1
  Filled 2022-12-11: qty 90, 90d supply, fill #2
  Filled 2023-03-15: qty 90, 90d supply, fill #3

## 2022-06-16 MED ORDER — HYDROCHLOROTHIAZIDE 25 MG PO TABS
25.0000 mg | ORAL_TABLET | Freq: Every morning | ORAL | 9 refills | Status: DC
  Filled 2022-06-16: qty 30, 30d supply, fill #0
  Filled 2022-08-10: qty 30, 30d supply, fill #1
  Filled 2022-09-28: qty 30, 30d supply, fill #2
  Filled 2022-11-02: qty 30, 30d supply, fill #3
  Filled 2022-12-07: qty 30, 30d supply, fill #4
  Filled 2023-01-16: qty 30, 30d supply, fill #5
  Filled 2023-02-21: qty 30, 30d supply, fill #6
  Filled 2023-03-15 – 2023-03-17 (×2): qty 30, 30d supply, fill #7
  Filled 2023-04-25: qty 30, 30d supply, fill #8
  Filled 2023-05-16 – 2023-05-19 (×3): qty 30, 30d supply, fill #9

## 2022-06-28 ENCOUNTER — Ambulatory Visit (HOSPITAL_BASED_OUTPATIENT_CLINIC_OR_DEPARTMENT_OTHER): Payer: Commercial Managed Care - PPO

## 2022-08-02 ENCOUNTER — Other Ambulatory Visit (HOSPITAL_BASED_OUTPATIENT_CLINIC_OR_DEPARTMENT_OTHER): Payer: Commercial Managed Care - PPO

## 2022-08-10 ENCOUNTER — Other Ambulatory Visit (HOSPITAL_COMMUNITY): Payer: Self-pay

## 2022-08-11 ENCOUNTER — Other Ambulatory Visit: Payer: Self-pay

## 2022-08-11 ENCOUNTER — Other Ambulatory Visit (HOSPITAL_COMMUNITY): Payer: Self-pay

## 2022-08-24 ENCOUNTER — Other Ambulatory Visit: Payer: Self-pay

## 2022-09-13 ENCOUNTER — Other Ambulatory Visit (HOSPITAL_COMMUNITY): Payer: Self-pay

## 2022-09-13 DIAGNOSIS — F410 Panic disorder [episodic paroxysmal anxiety] without agoraphobia: Secondary | ICD-10-CM | POA: Diagnosis not present

## 2022-09-13 DIAGNOSIS — Z6829 Body mass index (BMI) 29.0-29.9, adult: Secondary | ICD-10-CM | POA: Diagnosis not present

## 2022-09-13 DIAGNOSIS — I10 Essential (primary) hypertension: Secondary | ICD-10-CM | POA: Diagnosis not present

## 2022-09-13 DIAGNOSIS — H9319 Tinnitus, unspecified ear: Secondary | ICD-10-CM | POA: Diagnosis not present

## 2022-09-13 DIAGNOSIS — E782 Mixed hyperlipidemia: Secondary | ICD-10-CM | POA: Diagnosis not present

## 2022-09-13 DIAGNOSIS — G4700 Insomnia, unspecified: Secondary | ICD-10-CM | POA: Diagnosis not present

## 2022-09-13 DIAGNOSIS — E291 Testicular hypofunction: Secondary | ICD-10-CM | POA: Diagnosis not present

## 2022-09-13 MED ORDER — VALSARTAN 320 MG PO TABS
320.0000 mg | ORAL_TABLET | Freq: Every day | ORAL | 4 refills | Status: DC
  Filled 2022-09-13: qty 90, 90d supply, fill #0

## 2022-09-16 ENCOUNTER — Other Ambulatory Visit (HOSPITAL_COMMUNITY): Payer: Self-pay

## 2022-09-17 ENCOUNTER — Other Ambulatory Visit (HOSPITAL_COMMUNITY): Payer: Self-pay

## 2022-09-17 ENCOUNTER — Other Ambulatory Visit: Payer: Self-pay

## 2022-09-17 DIAGNOSIS — N4889 Other specified disorders of penis: Secondary | ICD-10-CM | POA: Diagnosis not present

## 2022-09-17 DIAGNOSIS — Z6828 Body mass index (BMI) 28.0-28.9, adult: Secondary | ICD-10-CM | POA: Diagnosis not present

## 2022-09-17 MED ORDER — FLUCONAZOLE 150 MG PO TABS
150.0000 mg | ORAL_TABLET | Freq: Every day | ORAL | 0 refills | Status: AC
  Filled 2022-09-17: qty 5, 5d supply, fill #0

## 2022-09-17 MED ORDER — MUPIROCIN 2 % EX OINT
TOPICAL_OINTMENT | Freq: Two times a day (BID) | CUTANEOUS | 0 refills | Status: AC
  Filled 2022-09-17: qty 22, 5d supply, fill #0

## 2022-09-21 ENCOUNTER — Other Ambulatory Visit (HOSPITAL_COMMUNITY): Payer: Self-pay

## 2022-09-21 DIAGNOSIS — Z6828 Body mass index (BMI) 28.0-28.9, adult: Secondary | ICD-10-CM | POA: Diagnosis not present

## 2022-09-21 DIAGNOSIS — Q5569 Other congenital malformation of penis: Secondary | ICD-10-CM | POA: Diagnosis not present

## 2022-09-21 MED ORDER — DOXYCYCLINE HYCLATE 100 MG PO CAPS
100.0000 mg | ORAL_CAPSULE | Freq: Two times a day (BID) | ORAL | 0 refills | Status: AC
  Filled 2022-09-21: qty 20, 10d supply, fill #0

## 2022-09-22 ENCOUNTER — Other Ambulatory Visit (HOSPITAL_COMMUNITY): Payer: Self-pay

## 2022-09-22 DIAGNOSIS — N481 Balanitis: Secondary | ICD-10-CM | POA: Diagnosis not present

## 2022-09-22 MED ORDER — AZITHROMYCIN 500 MG PO TABS
1000.0000 mg | ORAL_TABLET | Freq: Every day | ORAL | 0 refills | Status: AC
  Filled 2022-09-22: qty 2, 1d supply, fill #0

## 2022-09-26 ENCOUNTER — Emergency Department (HOSPITAL_COMMUNITY)
Admission: EM | Admit: 2022-09-26 | Discharge: 2022-09-26 | Disposition: A | Discharge status: Home / Self Care | Payer: Commercial Managed Care - PPO | Attending: Emergency Medicine | Admitting: Emergency Medicine

## 2022-09-26 ENCOUNTER — Emergency Department (HOSPITAL_COMMUNITY): Payer: Commercial Managed Care - PPO

## 2022-09-26 ENCOUNTER — Other Ambulatory Visit: Payer: Self-pay

## 2022-09-26 DIAGNOSIS — I10 Essential (primary) hypertension: Secondary | ICD-10-CM

## 2022-09-26 DIAGNOSIS — Z79899 Other long term (current) drug therapy: Secondary | ICD-10-CM | POA: Diagnosis not present

## 2022-09-26 DIAGNOSIS — R42 Dizziness and giddiness: Secondary | ICD-10-CM | POA: Insufficient documentation

## 2022-09-26 DIAGNOSIS — R0602 Shortness of breath: Secondary | ICD-10-CM | POA: Insufficient documentation

## 2022-09-26 LAB — COMPREHENSIVE METABOLIC PANEL
ALT: 21 U/L (ref 0–44)
AST: 20 U/L (ref 15–41)
Albumin: 4.3 g/dL (ref 3.5–5.0)
Alkaline Phosphatase: 79 U/L (ref 38–126)
Anion gap: 12 (ref 5–15)
BUN: 20 mg/dL (ref 6–20)
CO2: 21 mmol/L — ABNORMAL LOW (ref 22–32)
Calcium: 9.3 mg/dL (ref 8.9–10.3)
Chloride: 102 mmol/L (ref 98–111)
Creatinine, Ser: 1.08 mg/dL (ref 0.61–1.24)
GFR, Estimated: >60 mL/min (ref >60)
Glucose, Bld: 108 mg/dL — ABNORMAL HIGH (ref 70–99)
Potassium: 3.6 mmol/L (ref 3.5–5.1)
Sodium: 135 mmol/L (ref 135–145)
Total Bilirubin: 0.8 mg/dL (ref 0.3–1.2)
Total Protein: 8.4 g/dL — ABNORMAL HIGH (ref 6.5–8.1)

## 2022-09-26 LAB — CBC WITH DIFFERENTIAL/PLATELET
Abs Immature Granulocytes: 0.02 10*3/uL (ref 0.00–0.07)
Basophils Absolute: 0 10*3/uL (ref 0.0–0.1)
Basophils Relative: 1 %
Eosinophils Absolute: 0 10*3/uL (ref 0.0–0.5)
Eosinophils Relative: 1 %
HCT: 43.8 % (ref 39.0–52.0)
Hemoglobin: 15 g/dL (ref 13.0–17.0)
Immature Granulocytes: 0 %
Lymphocytes Relative: 25 %
Lymphs Abs: 1.5 10*3/uL (ref 0.7–4.0)
MCH: 31.3 pg (ref 26.0–34.0)
MCHC: 34.2 g/dL (ref 30.0–36.0)
MCV: 91.4 fL (ref 80.0–100.0)
Monocytes Absolute: 0.6 10*3/uL (ref 0.1–1.0)
Monocytes Relative: 10 %
Neutro Abs: 3.9 10*3/uL (ref 1.7–7.7)
Neutrophils Relative %: 63 %
Platelets: 282 10*3/uL (ref 150–400)
RBC: 4.79 MIL/uL (ref 4.22–5.81)
RDW: 12.4 % (ref 11.5–15.5)
WBC: 6 10*3/uL (ref 4.0–10.5)
nRBC: 0 % (ref 0.0–0.2)

## 2022-09-26 LAB — TROPONIN I (HIGH SENSITIVITY): Troponin I (High Sensitivity): 6 ng/L (ref <18)

## 2022-09-26 MED ORDER — SODIUM CHLORIDE 0.9 % IV BOLUS
500.0000 mL | Freq: Once | INTRAVENOUS | Status: AC
  Administered 2022-09-26: 500 mL via INTRAVENOUS

## 2022-09-26 NOTE — ED Provider Notes (Signed)
[place] EMERGENCY DEPARTMENT AT [facility] Provider Note   CSN: [id number] Arrival date & time: 09/26/22  1528     History {Add pertinent medical, surgical, social history, OB history to HPI:1} Chief Complaint  Patient presents with   Shortness of Breath   Dizziness    Jeff [name] is a 57 y.o. male.  Patient planes of dizziness for couple days.  Recently had a blood pressure medicine change.  Also he has had ringing in his ear for a number of weeks   Dizziness      Home Medications Prior to Admission medications   Medication Sig Start Date End Date Taking? Authorizing Provider  acetaminophen (TYLENOL) 500 MG tablet Take 500 mg by mouth every 6 (six) hours as needed for moderate pain or mild pain.    [provider]  amLODipine (NORVASC) 10 MG tablet Take 1 tablet (10 mg total) by mouth daily. 02/11/22     amLODipine (NORVASC) 5 MG tablet Take 1 tablet (5 mg total) by mouth daily. 06/16/22     azithromycin (ZITHROMAX) 500 MG tablet Take 2 tablets (1,000 mg total) by mouth as a single dose 09/22/22     buPROPion (WELLBUTRIN XL) 150 MG 24 hr tablet Take 1 tablet (150 mg total) by mouth every morning. 02/11/22     clobetasol cream (TEMOVATE) 0.05 % Apply 1 Application topically 2 (two) times daily. 06/16/22     Cyanocobalamin (B-12 PO) Take 1 tablet by mouth daily.    [provider]  docusate sodium (COLACE) 100 MG capsule Take 1 capsule (100 mg total) by mouth 2 (two) times daily as needed for mild constipation. 10/27/19   Maczis, Elmer Sow, PA-C  doxycycline (VIBRAMYCIN) 100 MG capsule Take 1 capsule (100 mg total) by mouth 2 (two) times daily for 10 days. 09/21/22     fluconazole (DIFLUCAN) 150 MG tablet Take 1 tablet (150 mg total) by mouth daily for 5 days 09/17/22     fluticasone (FLONASE) 50 MCG/ACT nasal spray Place 1-2 sprays into both nostrils daily. Patient taking differently: Place 1-2 sprays into both nostrils daily as needed for allergies or  rhinitis. 10/14/20     hydrochlorothiazide (HYDRODIURIL) 25 MG tablet Take 1 tablet (25 mg total) by mouth once a day  in the morning. 06/16/22     ibuprofen (ADVIL) 800 MG tablet Take 1 tablet (800 mg total) by mouth every 8 (eight) hours as needed. 02/02/22   Kinsinger, De Blanch, MD  LORazepam (ATIVAN) 1 MG tablet Take 1 tablet (1 mg total) by mouth 2 (two) times daily if needed Patient taking differently: Take 1 mg by mouth 2 (two) times daily as needed for anxiety. 11/13/21     LORazepam (ATIVAN) 1 MG tablet Take 1 tablet (1 mg total) by mouth 2 (two) times daily as needed. 04/20/22     mupirocin ointment (BACTROBAN) 2 % Apply 1 application externally 2 (two) times daily for 5 days 09/17/22     nystatin (MYCOSTATIN) 100000 UNIT/ML suspension Swish and swallow 4 mLs (400,000 Units total) 4 (four) times daily for 10 days 02/16/22     oxyCODONE (OXY IR/ROXICODONE) 5 MG immediate release tablet Take 1 tablet (5 mg total) by mouth every 6 (six) hours as needed for severe pain. 02/02/22   Kinsinger, De Blanch, MD  pantoprazole (PROTONIX) 40 MG tablet Take 1 tablet (40 mg total) by mouth daily. 11/04/20     pantoprazole (PROTONIX) 40 MG tablet Take 1 tablet (40 mg  total) by mouth daily. 05/12/22     polyethylene glycol (MIRALAX / GLYCOLAX) 17 g packet Take 17 g by mouth daily as needed for mild constipation. 10/27/19   Maczis, Elmer Sow, PA-C  testosterone cypionate (DEPOTESTOSTERONE CYPIONATE) 200 MG/ML injection Inject 0.25 mLs (50 mg total) into the muscle once a week. 08/11/21     testosterone cypionate (DEPOTESTOSTERONE CYPIONATE) 200 MG/ML injection Inject 0.25 mLs (50 mg total) into the muscle once a week. 04/20/22     valsartan (DIOVAN) 320 MG tablet Take 1 tablet (320 mg total) by mouth daily. 09/13/22     zolpidem (AMBIEN) 10 MG tablet Take 1 tablet (10 mg total) by mouth at bedtime as needed Patient taking differently: Take 10 mg by mouth at bedtime as needed for sleep. 02/23/21     losartan (COZAAR)  100 MG tablet Take 1 tablet (100 mg total) by mouth daily. 06/15/22 09/13/22        Allergies    Penicillins    Review of Systems   Review of Systems  Neurological:  Positive for dizziness.    Physical Exam Updated Vital Signs BP (!) 147/93   Pulse 75   Temp 98.6 F (37 C) (Oral)   Resp (!) 21   Ht 6' (1.829 m)   Wt 97.5 kg   SpO2 100%   BMI 29.16 kg/m  Physical Exam  ED Results / Procedures / Treatments   Labs (all labs ordered are listed, but only abnormal results are displayed) Labs Reviewed  COMPREHENSIVE METABOLIC PANEL - Abnormal; Notable for the following components:      Result Value   CO2 21 (*)    Glucose, Bld 108 (*)    Total Protein 8.4 (*)    All other components within normal limits  CBC WITH DIFFERENTIAL/PLATELET  TROPONIN I (HIGH SENSITIVITY)  TROPONIN I (HIGH SENSITIVITY)    EKG EKG Interpretation  Date/Time:  Sunday Sep 26 2022 16:04:28 EDT Ventricular Rate:  76 PR Interval:  178 QRS Duration: 87 QT Interval:  372 QTC Calculation: 419 R Axis:   19 Text Interpretation: Sinus rhythm Ventricular premature complex Borderline T abnormalities, inferior leads Confirmed by Bethann Berkshire [id number]) on 09/26/2022 4:36:52 PM  Radiology No results found.  Procedures Procedures  {Document cardiac monitor, telemetry assessment procedure when appropriate:1}  Medications Ordered in ED Medications  sodium chloride 0.9 % bolus 500 mL (500 mLs Intravenous New Bag/Given 09/26/22 1737)    ED Course/ Medical Decision Making/ A&P   {   Click here for ABCD2, HEART and other calculatorsREFRESH Note before signing :1}                          Medical Decision Making Amount and/or Complexity of Data Reviewed Labs: ordered. Radiology: ordered.   Patient with poorly controlled blood pressure most likely causing his dizziness.  He will record his blood pressure twice a day and get in touch with his doctor.  Patient also having ear ringing that he is referred  to ENT to  {Document critical care time when appropriate:1} {Document review of labs and clinical decision tools ie heart score, Chads2Vasc2 etc:1}  {Document your independent review of radiology images, and any outside records:1} {Document your discussion with family members, caretakers, and with consultants:1} {Document social determinants of health affecting pt's care:1} {Document your decision making why or why not admission, treatments were needed:1} Final Clinical Impression(s) / ED Diagnoses Final diagnoses:  Dizziness  Primary hypertension  Rx / DC Orders ED Discharge Orders     None

## 2022-09-26 NOTE — ED Triage Notes (Signed)
Pt arrived via POV. C/o SOB, and dizziness for a few days.  Hx htn   Also c/o tinnitus, and a sore on his penis which he has been given abx.  Aox4, independently mobile

## 2022-09-26 NOTE — Discharge Instructions (Signed)
Record your blood pressure twice a day and let your doctor know how you are doing.  You also have been referred to an ENT doctor for the ringing in your ear

## 2022-09-27 ENCOUNTER — Other Ambulatory Visit (HOSPITAL_COMMUNITY): Payer: Self-pay

## 2022-09-28 ENCOUNTER — Other Ambulatory Visit (HOSPITAL_COMMUNITY): Payer: Self-pay

## 2022-09-28 MED ORDER — LOSARTAN POTASSIUM 100 MG PO TABS
100.0000 mg | ORAL_TABLET | Freq: Every day | ORAL | 3 refills | Status: DC
  Filled 2022-09-28 (×2): qty 90, 90d supply, fill #0
  Filled 2022-12-11: qty 90, 90d supply, fill #1
  Filled 2023-02-21 – 2023-03-15 (×2): qty 90, 90d supply, fill #2
  Filled 2023-05-16 – 2023-06-20 (×4): qty 90, 90d supply, fill #3

## 2022-10-08 ENCOUNTER — Other Ambulatory Visit: Payer: Self-pay

## 2022-10-08 ENCOUNTER — Other Ambulatory Visit (HOSPITAL_COMMUNITY): Payer: Self-pay

## 2022-10-12 ENCOUNTER — Ambulatory Visit (HOSPITAL_BASED_OUTPATIENT_CLINIC_OR_DEPARTMENT_OTHER)
Admission: RE | Admit: 2022-10-12 | Discharge: 2022-10-12 | Disposition: A | Discharge status: Home / Self Care | Payer: Commercial Managed Care - PPO | Source: Ambulatory Visit | Attending: Cardiovascular Disease | Admitting: Cardiovascular Disease

## 2022-10-12 ENCOUNTER — Other Ambulatory Visit (HOSPITAL_COMMUNITY): Payer: Self-pay

## 2022-10-12 DIAGNOSIS — I10 Essential (primary) hypertension: Secondary | ICD-10-CM | POA: Insufficient documentation

## 2022-10-12 DIAGNOSIS — E782 Mixed hyperlipidemia: Secondary | ICD-10-CM | POA: Insufficient documentation

## 2022-10-12 DIAGNOSIS — R499 Unspecified voice and resonance disorder: Secondary | ICD-10-CM | POA: Diagnosis not present

## 2022-10-12 DIAGNOSIS — R0789 Other chest pain: Secondary | ICD-10-CM | POA: Insufficient documentation

## 2022-10-12 DIAGNOSIS — Z6829 Body mass index (BMI) 29.0-29.9, adult: Secondary | ICD-10-CM | POA: Diagnosis not present

## 2022-10-12 DIAGNOSIS — Z8249 Family history of ischemic heart disease and other diseases of the circulatory system: Secondary | ICD-10-CM | POA: Insufficient documentation

## 2022-10-12 DIAGNOSIS — J309 Allergic rhinitis, unspecified: Secondary | ICD-10-CM | POA: Diagnosis not present

## 2022-10-12 DIAGNOSIS — H9319 Tinnitus, unspecified ear: Secondary | ICD-10-CM | POA: Diagnosis not present

## 2022-10-12 MED ORDER — FLUTICASONE PROPIONATE 50 MCG/ACT NA SUSP
1.0000 | Freq: Every day | NASAL | 1 refills | Status: AC
  Filled 2022-10-12: qty 48, 90d supply, fill #0

## 2022-10-13 ENCOUNTER — Inpatient Hospital Stay: Payer: Commercial Managed Care - PPO | Attending: Internal Medicine | Admitting: Internal Medicine

## 2022-10-13 ENCOUNTER — Inpatient Hospital Stay: Payer: Commercial Managed Care - PPO

## 2022-11-05 ENCOUNTER — Other Ambulatory Visit (HOSPITAL_COMMUNITY): Payer: Self-pay

## 2022-11-08 ENCOUNTER — Other Ambulatory Visit (HOSPITAL_COMMUNITY): Payer: Self-pay

## 2022-11-08 MED ORDER — ZOLPIDEM TARTRATE 10 MG PO TABS
10.0000 mg | ORAL_TABLET | Freq: Every evening | ORAL | 0 refills | Status: DC | PRN
  Filled 2022-11-08: qty 90, 90d supply, fill #0

## 2022-11-08 MED ORDER — LORAZEPAM 1 MG PO TABS
1.0000 mg | ORAL_TABLET | Freq: Two times a day (BID) | ORAL | 0 refills | Status: AC | PRN
  Filled 2022-11-08: qty 60, 30d supply, fill #0

## 2022-11-09 ENCOUNTER — Other Ambulatory Visit (HOSPITAL_COMMUNITY): Payer: Self-pay

## 2022-11-18 DIAGNOSIS — K5900 Constipation, unspecified: Secondary | ICD-10-CM | POA: Diagnosis not present

## 2022-11-18 DIAGNOSIS — Z880 Allergy status to penicillin: Secondary | ICD-10-CM | POA: Diagnosis not present

## 2022-11-18 DIAGNOSIS — R1030 Lower abdominal pain, unspecified: Secondary | ICD-10-CM | POA: Diagnosis not present

## 2022-11-18 DIAGNOSIS — R140 Abdominal distension (gaseous): Secondary | ICD-10-CM | POA: Diagnosis not present

## 2022-11-18 DIAGNOSIS — I10 Essential (primary) hypertension: Secondary | ICD-10-CM | POA: Diagnosis not present

## 2022-11-18 DIAGNOSIS — Z8547 Personal history of malignant neoplasm of testis: Secondary | ICD-10-CM | POA: Diagnosis not present

## 2022-12-11 ENCOUNTER — Other Ambulatory Visit (HOSPITAL_COMMUNITY): Payer: Self-pay

## 2022-12-13 ENCOUNTER — Other Ambulatory Visit (HOSPITAL_COMMUNITY): Payer: Self-pay

## 2022-12-13 MED ORDER — PANTOPRAZOLE SODIUM 40 MG PO TBEC
40.0000 mg | DELAYED_RELEASE_TABLET | Freq: Every day | ORAL | 1 refills | Status: DC
  Filled 2022-12-13: qty 90, 90d supply, fill #0
  Filled 2023-04-25: qty 90, 90d supply, fill #1

## 2022-12-14 ENCOUNTER — Other Ambulatory Visit (HOSPITAL_COMMUNITY): Payer: Self-pay

## 2023-01-04 DIAGNOSIS — L570 Actinic keratosis: Secondary | ICD-10-CM | POA: Diagnosis not present

## 2023-01-04 DIAGNOSIS — L820 Inflamed seborrheic keratosis: Secondary | ICD-10-CM | POA: Diagnosis not present

## 2023-01-04 DIAGNOSIS — L814 Other melanin hyperpigmentation: Secondary | ICD-10-CM | POA: Diagnosis not present

## 2023-02-07 ENCOUNTER — Other Ambulatory Visit (HOSPITAL_COMMUNITY): Payer: Self-pay

## 2023-02-07 DIAGNOSIS — F411 Generalized anxiety disorder: Secondary | ICD-10-CM | POA: Diagnosis not present

## 2023-02-07 DIAGNOSIS — Z23 Encounter for immunization: Secondary | ICD-10-CM | POA: Diagnosis not present

## 2023-02-07 DIAGNOSIS — Z6828 Body mass index (BMI) 28.0-28.9, adult: Secondary | ICD-10-CM | POA: Diagnosis not present

## 2023-02-07 DIAGNOSIS — M25561 Pain in right knee: Secondary | ICD-10-CM | POA: Diagnosis not present

## 2023-02-07 MED ORDER — DESVENLAFAXINE SUCCINATE ER 25 MG PO TB24
25.0000 mg | ORAL_TABLET | Freq: Every day | ORAL | 1 refills | Status: AC
  Filled 2023-02-07: qty 30, 30d supply, fill #0
  Filled 2023-03-07: qty 30, 30d supply, fill #1

## 2023-02-13 DIAGNOSIS — R21 Rash and other nonspecific skin eruption: Secondary | ICD-10-CM | POA: Diagnosis not present

## 2023-02-21 ENCOUNTER — Other Ambulatory Visit (HOSPITAL_COMMUNITY): Payer: Self-pay

## 2023-03-07 ENCOUNTER — Other Ambulatory Visit (HOSPITAL_COMMUNITY): Payer: Self-pay

## 2023-03-08 ENCOUNTER — Other Ambulatory Visit (HOSPITAL_COMMUNITY): Payer: Self-pay

## 2023-03-08 MED ORDER — LORAZEPAM 1 MG PO TABS
1.0000 mg | ORAL_TABLET | Freq: Two times a day (BID) | ORAL | 0 refills | Status: AC | PRN
  Filled 2023-03-08: qty 60, 30d supply, fill #0

## 2023-03-14 ENCOUNTER — Other Ambulatory Visit (HOSPITAL_COMMUNITY): Payer: Self-pay

## 2023-03-14 DIAGNOSIS — Z6828 Body mass index (BMI) 28.0-28.9, adult: Secondary | ICD-10-CM | POA: Diagnosis not present

## 2023-03-14 DIAGNOSIS — F411 Generalized anxiety disorder: Secondary | ICD-10-CM | POA: Diagnosis not present

## 2023-03-14 DIAGNOSIS — R7301 Impaired fasting glucose: Secondary | ICD-10-CM | POA: Diagnosis not present

## 2023-03-14 DIAGNOSIS — F410 Panic disorder [episodic paroxysmal anxiety] without agoraphobia: Secondary | ICD-10-CM | POA: Diagnosis not present

## 2023-03-14 DIAGNOSIS — E291 Testicular hypofunction: Secondary | ICD-10-CM | POA: Diagnosis not present

## 2023-03-14 MED ORDER — DESVENLAFAXINE SUCCINATE ER 50 MG PO TB24
50.0000 mg | ORAL_TABLET | Freq: Every day | ORAL | 4 refills | Status: DC
  Filled 2023-03-14: qty 90, 90d supply, fill #0
  Filled 2023-05-16 – 2023-06-20 (×4): qty 90, 90d supply, fill #1
  Filled 2023-09-24: qty 90, 90d supply, fill #2
  Filled 2023-12-28 (×2): qty 90, 90d supply, fill #3

## 2023-03-15 ENCOUNTER — Other Ambulatory Visit: Payer: Self-pay

## 2023-03-15 ENCOUNTER — Other Ambulatory Visit (HOSPITAL_COMMUNITY): Payer: Self-pay

## 2023-03-17 ENCOUNTER — Other Ambulatory Visit: Payer: Self-pay

## 2023-04-26 ENCOUNTER — Other Ambulatory Visit (HOSPITAL_COMMUNITY): Payer: Self-pay

## 2023-05-16 ENCOUNTER — Other Ambulatory Visit (HOSPITAL_COMMUNITY): Payer: Self-pay

## 2023-05-16 ENCOUNTER — Encounter (HOSPITAL_COMMUNITY): Payer: Self-pay

## 2023-05-19 ENCOUNTER — Other Ambulatory Visit (HOSPITAL_COMMUNITY): Payer: Self-pay

## 2023-05-19 MED ORDER — AMLODIPINE BESYLATE 5 MG PO TABS
5.0000 mg | ORAL_TABLET | Freq: Every day | ORAL | 1 refills | Status: DC
  Filled 2023-06-20 (×2): qty 90, 90d supply, fill #0
  Filled 2023-09-24: qty 90, 90d supply, fill #1

## 2023-06-20 ENCOUNTER — Other Ambulatory Visit (HOSPITAL_COMMUNITY): Payer: Self-pay

## 2023-06-20 ENCOUNTER — Other Ambulatory Visit: Payer: Self-pay

## 2023-06-21 ENCOUNTER — Other Ambulatory Visit (HOSPITAL_COMMUNITY): Payer: Self-pay

## 2023-06-21 MED ORDER — HYDROCHLOROTHIAZIDE 25 MG PO TABS
25.0000 mg | ORAL_TABLET | Freq: Every morning | ORAL | 1 refills | Status: DC
  Filled 2023-06-21: qty 90, 90d supply, fill #0
  Filled 2023-08-08 – 2023-09-24 (×2): qty 90, 90d supply, fill #1

## 2023-08-08 ENCOUNTER — Other Ambulatory Visit (HOSPITAL_COMMUNITY): Payer: Self-pay

## 2023-08-08 MED ORDER — PANTOPRAZOLE SODIUM 40 MG PO TBEC
40.0000 mg | DELAYED_RELEASE_TABLET | Freq: Every day | ORAL | 1 refills | Status: DC
  Filled 2023-08-08: qty 90, 90d supply, fill #0
  Filled 2023-11-15: qty 90, 90d supply, fill #1

## 2023-08-09 ENCOUNTER — Encounter (HOSPITAL_COMMUNITY): Payer: Self-pay

## 2023-08-09 ENCOUNTER — Other Ambulatory Visit (HOSPITAL_COMMUNITY): Payer: Self-pay

## 2023-08-09 ENCOUNTER — Other Ambulatory Visit: Payer: Self-pay

## 2023-08-09 MED ORDER — TESTOSTERONE CYPIONATE 200 MG/ML IM SOLN
50.0000 mg | INTRAMUSCULAR | 0 refills | Status: AC
  Filled 2023-08-09: qty 4, 30d supply, fill #0

## 2023-08-09 MED ORDER — "SYRINGE/NEEDLE (DISP) 22G X 1-1/2"" 3 ML MISC"
1 refills | Status: AC
  Filled 2023-08-09: qty 25, 30d supply, fill #0

## 2023-08-09 MED ORDER — LORAZEPAM 1 MG PO TABS
1.0000 mg | ORAL_TABLET | Freq: Two times a day (BID) | ORAL | 1 refills | Status: AC
  Filled 2023-08-09: qty 60, 30d supply, fill #0

## 2023-08-10 ENCOUNTER — Other Ambulatory Visit (HOSPITAL_COMMUNITY): Payer: Self-pay

## 2023-08-12 ENCOUNTER — Other Ambulatory Visit (HOSPITAL_COMMUNITY): Payer: Self-pay

## 2023-08-12 MED ORDER — ZOLPIDEM TARTRATE 10 MG PO TABS
10.0000 mg | ORAL_TABLET | Freq: Every evening | ORAL | 0 refills | Status: AC | PRN
  Filled 2023-08-12: qty 90, 90d supply, fill #0

## 2023-09-24 ENCOUNTER — Other Ambulatory Visit (HOSPITAL_COMMUNITY): Payer: Self-pay

## 2023-09-26 ENCOUNTER — Other Ambulatory Visit: Payer: Self-pay

## 2023-09-26 ENCOUNTER — Other Ambulatory Visit (HOSPITAL_COMMUNITY): Payer: Self-pay

## 2023-09-26 MED ORDER — LOSARTAN POTASSIUM 100 MG PO TABS
100.0000 mg | ORAL_TABLET | Freq: Every day | ORAL | 0 refills | Status: DC
  Filled 2023-09-26: qty 90, 90d supply, fill #0

## 2023-12-19 DIAGNOSIS — Z0000 Encounter for general adult medical examination without abnormal findings: Secondary | ICD-10-CM | POA: Diagnosis not present

## 2023-12-19 DIAGNOSIS — I10 Essential (primary) hypertension: Secondary | ICD-10-CM | POA: Diagnosis not present

## 2023-12-19 DIAGNOSIS — E782 Mixed hyperlipidemia: Secondary | ICD-10-CM | POA: Diagnosis not present

## 2023-12-19 DIAGNOSIS — E291 Testicular hypofunction: Secondary | ICD-10-CM | POA: Diagnosis not present

## 2023-12-19 DIAGNOSIS — Z1211 Encounter for screening for malignant neoplasm of colon: Secondary | ICD-10-CM | POA: Diagnosis not present

## 2023-12-19 DIAGNOSIS — Z23 Encounter for immunization: Secondary | ICD-10-CM | POA: Diagnosis not present

## 2023-12-19 DIAGNOSIS — R7301 Impaired fasting glucose: Secondary | ICD-10-CM | POA: Diagnosis not present

## 2023-12-27 DIAGNOSIS — L821 Other seborrheic keratosis: Secondary | ICD-10-CM | POA: Diagnosis not present

## 2023-12-27 DIAGNOSIS — L820 Inflamed seborrheic keratosis: Secondary | ICD-10-CM | POA: Diagnosis not present

## 2023-12-27 DIAGNOSIS — L918 Other hypertrophic disorders of the skin: Secondary | ICD-10-CM | POA: Diagnosis not present

## 2023-12-28 ENCOUNTER — Other Ambulatory Visit (HOSPITAL_COMMUNITY): Payer: Self-pay

## 2023-12-28 ENCOUNTER — Other Ambulatory Visit: Payer: Self-pay

## 2023-12-28 MED ORDER — HYDROCHLOROTHIAZIDE 25 MG PO TABS
25.0000 mg | ORAL_TABLET | Freq: Every morning | ORAL | 0 refills | Status: DC
  Filled 2023-12-28 (×2): qty 90, 90d supply, fill #0

## 2023-12-28 MED ORDER — LOSARTAN POTASSIUM 100 MG PO TABS
100.0000 mg | ORAL_TABLET | Freq: Every day | ORAL | 0 refills | Status: DC
  Filled 2023-12-28: qty 90, 90d supply, fill #0

## 2023-12-28 MED ORDER — AMLODIPINE BESYLATE 5 MG PO TABS
5.0000 mg | ORAL_TABLET | Freq: Every day | ORAL | 0 refills | Status: DC
  Filled 2023-12-28: qty 90, 90d supply, fill #0

## 2024-01-10 DIAGNOSIS — Z1212 Encounter for screening for malignant neoplasm of rectum: Secondary | ICD-10-CM | POA: Diagnosis not present

## 2024-01-10 DIAGNOSIS — Z1211 Encounter for screening for malignant neoplasm of colon: Secondary | ICD-10-CM | POA: Diagnosis not present

## 2024-02-20 ENCOUNTER — Other Ambulatory Visit (HOSPITAL_COMMUNITY): Payer: Self-pay

## 2024-02-20 MED ORDER — PANTOPRAZOLE SODIUM 40 MG PO TBEC
40.0000 mg | DELAYED_RELEASE_TABLET | Freq: Every day | ORAL | 1 refills | Status: AC
  Filled 2024-02-20: qty 90, 90d supply, fill #0
  Filled 2024-04-09: qty 90, 90d supply, fill #1

## 2024-02-20 MED ORDER — TESTOSTERONE CYPIONATE 200 MG/ML IM SOLN
50.0000 mg | INTRAMUSCULAR | 2 refills | Status: AC
  Filled 2024-02-20: qty 4, 28d supply, fill #0
  Filled 2024-04-29: qty 4, 28d supply, fill #1

## 2024-02-20 MED ORDER — LORAZEPAM 1 MG PO TABS
1.0000 mg | ORAL_TABLET | Freq: Two times a day (BID) | ORAL | 2 refills | Status: AC | PRN
  Filled 2024-02-20: qty 60, 30d supply, fill #0

## 2024-02-21 ENCOUNTER — Other Ambulatory Visit (HOSPITAL_COMMUNITY): Payer: Self-pay

## 2024-04-09 ENCOUNTER — Other Ambulatory Visit (HOSPITAL_COMMUNITY): Payer: Self-pay

## 2024-04-09 MED ORDER — AMLODIPINE BESYLATE 5 MG PO TABS
5.0000 mg | ORAL_TABLET | Freq: Every day | ORAL | 0 refills | Status: AC
Start: 2024-04-09 — End: ?
  Filled 2024-04-09: qty 90, 90d supply, fill #0

## 2024-04-09 MED ORDER — DESVENLAFAXINE SUCCINATE ER 50 MG PO TB24
50.0000 mg | ORAL_TABLET | Freq: Every day | ORAL | 1 refills | Status: AC
Start: 2024-04-09 — End: ?
  Filled 2024-04-09: qty 90, 90d supply, fill #0

## 2024-04-09 MED ORDER — LOSARTAN POTASSIUM 100 MG PO TABS
100.0000 mg | ORAL_TABLET | Freq: Every day | ORAL | 0 refills | Status: AC
Start: 2024-04-09 — End: ?
  Filled 2024-04-09: qty 90, 90d supply, fill #0

## 2024-04-09 MED ORDER — HYDROCHLOROTHIAZIDE 25 MG PO TABS
25.0000 mg | ORAL_TABLET | Freq: Every morning | ORAL | 0 refills | Status: AC
  Filled 2024-04-09: qty 90, 90d supply, fill #0

## 2024-04-30 ENCOUNTER — Other Ambulatory Visit: Payer: Self-pay

## 2024-05-01 ENCOUNTER — Other Ambulatory Visit (HOSPITAL_COMMUNITY): Payer: Self-pay

## 2024-05-01 DIAGNOSIS — J019 Acute sinusitis, unspecified: Secondary | ICD-10-CM | POA: Diagnosis not present

## 2024-05-01 DIAGNOSIS — Z6831 Body mass index (BMI) 31.0-31.9, adult: Secondary | ICD-10-CM | POA: Diagnosis not present

## 2024-05-01 DIAGNOSIS — Z23 Encounter for immunization: Secondary | ICD-10-CM | POA: Diagnosis not present

## 2024-05-01 DIAGNOSIS — F411 Generalized anxiety disorder: Secondary | ICD-10-CM | POA: Diagnosis not present

## 2024-05-01 DIAGNOSIS — G4700 Insomnia, unspecified: Secondary | ICD-10-CM | POA: Diagnosis not present

## 2024-05-01 DIAGNOSIS — I10 Essential (primary) hypertension: Secondary | ICD-10-CM | POA: Diagnosis not present

## 2024-05-01 DIAGNOSIS — E291 Testicular hypofunction: Secondary | ICD-10-CM | POA: Diagnosis not present

## 2024-05-01 MED ORDER — AZITHROMYCIN 250 MG PO TABS
ORAL_TABLET | ORAL | 0 refills | Status: AC
  Filled 2024-05-01: qty 6, 5d supply, fill #0

## 2024-05-01 MED ORDER — ZOLPIDEM TARTRATE 10 MG PO TABS
10.0000 mg | ORAL_TABLET | Freq: Every evening | ORAL | 1 refills | Status: AC | PRN
  Filled 2024-05-01: qty 90, 90d supply, fill #0

## 2024-05-01 MED ORDER — TESTOSTERONE CYPIONATE 200 MG/ML IM SOLN
50.0000 mg | INTRAMUSCULAR | 2 refills | Status: AC
  Filled 2024-05-01: qty 1, 28d supply, fill #0

## 2024-07-02 ENCOUNTER — Inpatient Hospital Stay

## 2024-07-02 ENCOUNTER — Inpatient Hospital Stay: Admitting: Hematology
